# Patient Record
Sex: Female | Born: 1965 | Race: Black or African American | Hispanic: No | Marital: Single | Smoking: Never smoker
Health system: Southern US, Community
[De-identification: ages and names within clinical notes are randomized; demographics above are authoritative.]

## PROBLEM LIST (undated history)

## (undated) DIAGNOSIS — F32A Depression, unspecified: Secondary | ICD-10-CM

## (undated) DIAGNOSIS — D259 Leiomyoma of uterus, unspecified: Secondary | ICD-10-CM

## (undated) DIAGNOSIS — R42 Dizziness and giddiness: Secondary | ICD-10-CM

## (undated) DIAGNOSIS — D649 Anemia, unspecified: Secondary | ICD-10-CM

## (undated) DIAGNOSIS — F329 Major depressive disorder, single episode, unspecified: Secondary | ICD-10-CM

## (undated) DIAGNOSIS — Z531 Procedure and treatment not carried out because of patient's decision for reasons of belief and group pressure: Secondary | ICD-10-CM

## (undated) DIAGNOSIS — N920 Excessive and frequent menstruation with regular cycle: Secondary | ICD-10-CM

## (undated) DIAGNOSIS — I499 Cardiac arrhythmia, unspecified: Secondary | ICD-10-CM

## (undated) HISTORY — PX: TOOTH EXTRACTION: SUR596

## (undated) HISTORY — DX: Leiomyoma of uterus, unspecified: D25.9

## (undated) HISTORY — PX: NO PAST SURGERIES: SHX2092

## (undated) HISTORY — DX: Morbid (severe) obesity due to excess calories: E66.01

---

## 1997-05-22 ENCOUNTER — Emergency Department (HOSPITAL_COMMUNITY): Admission: EM | Admit: 1997-05-22 | Discharge: 1997-05-22 | Payer: Self-pay | Admitting: Emergency Medicine

## 1998-05-05 ENCOUNTER — Emergency Department (HOSPITAL_COMMUNITY): Admission: EM | Admit: 1998-05-05 | Discharge: 1998-05-05 | Payer: Self-pay | Admitting: Internal Medicine

## 2001-05-02 ENCOUNTER — Encounter: Admission: RE | Admit: 2001-05-02 | Discharge: 2001-05-02 | Payer: Self-pay | Admitting: *Deleted

## 2007-03-11 ENCOUNTER — Emergency Department (HOSPITAL_COMMUNITY): Admission: EM | Admit: 2007-03-11 | Discharge: 2007-03-11 | Payer: Self-pay | Admitting: Emergency Medicine

## 2010-12-29 ENCOUNTER — Encounter: Payer: Self-pay | Admitting: Emergency Medicine

## 2010-12-29 ENCOUNTER — Other Ambulatory Visit: Payer: Self-pay

## 2010-12-29 ENCOUNTER — Inpatient Hospital Stay (HOSPITAL_COMMUNITY): Payer: Self-pay

## 2010-12-29 ENCOUNTER — Emergency Department (HOSPITAL_COMMUNITY): Payer: Self-pay

## 2010-12-29 ENCOUNTER — Inpatient Hospital Stay (HOSPITAL_COMMUNITY)
Admission: EM | Admit: 2010-12-29 | Discharge: 2011-01-02 | DRG: 812 | Disposition: A | Discharge status: Home / Self Care | Payer: Self-pay | Attending: Internal Medicine | Admitting: Internal Medicine

## 2010-12-29 DIAGNOSIS — R0602 Shortness of breath: Secondary | ICD-10-CM

## 2010-12-29 DIAGNOSIS — I498 Other specified cardiac arrhythmias: Secondary | ICD-10-CM | POA: Diagnosis present

## 2010-12-29 DIAGNOSIS — IMO0001 Reserved for inherently not codable concepts without codable children: Secondary | ICD-10-CM

## 2010-12-29 DIAGNOSIS — Z531 Procedure and treatment not carried out because of patient's decision for reasons of belief and group pressure: Secondary | ICD-10-CM

## 2010-12-29 DIAGNOSIS — D649 Anemia, unspecified: Secondary | ICD-10-CM

## 2010-12-29 DIAGNOSIS — I499 Cardiac arrhythmia, unspecified: Secondary | ICD-10-CM

## 2010-12-29 DIAGNOSIS — R000 Tachycardia, unspecified: Secondary | ICD-10-CM

## 2010-12-29 DIAGNOSIS — R42 Dizziness and giddiness: Secondary | ICD-10-CM

## 2010-12-29 DIAGNOSIS — N920 Excessive and frequent menstruation with regular cycle: Secondary | ICD-10-CM | POA: Diagnosis present

## 2010-12-29 DIAGNOSIS — D509 Iron deficiency anemia, unspecified: Principal | ICD-10-CM | POA: Diagnosis present

## 2010-12-29 HISTORY — DX: Depression, unspecified: F32.A

## 2010-12-29 HISTORY — DX: Procedure and treatment not carried out because of patient's decision for reasons of belief and group pressure: Z53.1

## 2010-12-29 HISTORY — DX: Major depressive disorder, single episode, unspecified: F32.9

## 2010-12-29 HISTORY — DX: Dizziness and giddiness: R42

## 2010-12-29 HISTORY — DX: Anemia, unspecified: D64.9

## 2010-12-29 HISTORY — DX: Cardiac arrhythmia, unspecified: I49.9

## 2010-12-29 HISTORY — DX: Reserved for inherently not codable concepts without codable children: IMO0001

## 2010-12-29 LAB — CBC
HCT: 19.1 % — ABNORMAL LOW (ref 36.0–46.0)
Hemoglobin: 4.9 g/dL — CL (ref 12.0–15.0)
MCH: 14.5 pg — ABNORMAL LOW (ref 26.0–34.0)
MCH: 14.5 pg — ABNORMAL LOW (ref 26.0–34.0)
MCHC: 25.7 g/dL — ABNORMAL LOW (ref 30.0–36.0)
MCV: 56.5 fL — ABNORMAL LOW (ref 78.0–100.0)
MCV: 56.5 fL — ABNORMAL LOW (ref 78.0–100.0)
Platelets: 308 10*3/uL (ref 150–400)
Platelets: 318 10*3/uL (ref 150–400)
Platelets: 264 10*3/uL (ref 150–400)
RBC: 3.38 MIL/uL — ABNORMAL LOW (ref 3.87–5.11)
RBC: 3.59 MIL/uL — ABNORMAL LOW (ref 3.87–5.11)
RDW: 19.3 % — ABNORMAL HIGH (ref 11.5–15.5)
RDW: 19.7 % — ABNORMAL HIGH (ref 11.5–15.5)
WBC: 6.2 10*3/uL (ref 4.0–10.5)
WBC: 6.5 10*3/uL (ref 4.0–10.5)
WBC: 6.2 10*3/uL (ref 4.0–10.5)

## 2010-12-29 LAB — DIFFERENTIAL
Basophils Absolute: 0.1 10*3/uL (ref 0.0–0.1)
Basophils Relative: 1 % (ref 0–1)
Eosinophils Absolute: 0.1 10*3/uL (ref 0.0–0.7)
Eosinophils Relative: 1 % (ref 0–5)
Lymphocytes Relative: 23 % (ref 12–46)
Lymphs Abs: 1.4 10*3/uL (ref 0.7–4.0)
Monocytes Absolute: 0.6 10*3/uL (ref 0.1–1.0)
Monocytes Relative: 10 % (ref 3–12)
Neutro Abs: 4 10*3/uL (ref 1.7–7.7)
Neutrophils Relative %: 65 % (ref 43–77)

## 2010-12-29 LAB — CARDIAC PANEL(CRET KIN+CKTOT+MB+TROPI)
CK, MB: 3.1 ng/mL (ref 0.3–4.0)
CK, MB: 2.6 ng/mL (ref 0.3–4.0)
Troponin I: 0.34 ng/mL (ref <0.30)

## 2010-12-29 LAB — IRON AND TIBC
Iron: <10 ug/dL — ABNORMAL LOW (ref 42–135)
UIBC: 471 ug/dL — ABNORMAL HIGH (ref 125–400)

## 2010-12-29 LAB — URINALYSIS, ROUTINE W REFLEX MICROSCOPIC
Glucose, UA: NEGATIVE mg/dL
Hgb urine dipstick: NEGATIVE
Specific Gravity, Urine: 1.012 (ref 1.005–1.030)

## 2010-12-29 LAB — RAPID URINE DRUG SCREEN, HOSP PERFORMED
Amphetamines: NOT DETECTED
Benzodiazepines: NOT DETECTED
Cocaine: NOT DETECTED
Opiates: NOT DETECTED
Tetrahydrocannabinol: NOT DETECTED

## 2010-12-29 LAB — HIV ANTIBODY (ROUTINE TESTING W REFLEX): HIV: NONREACTIVE

## 2010-12-29 LAB — COMPREHENSIVE METABOLIC PANEL
AST: 13 U/L (ref 0–37)
BUN: 10 mg/dL (ref 6–23)
CO2: 21 mEq/L (ref 19–32)
Calcium: 8.8 mg/dL (ref 8.4–10.5)
Chloride: 109 mEq/L (ref 96–112)
Creatinine, Ser: 0.86 mg/dL (ref 0.50–1.10)
GFR calc Af Amer: >90 mL/min (ref >90)
GFR calc non Af Amer: 82 mL/min — ABNORMAL LOW (ref >90)
Glucose, Bld: 114 mg/dL — ABNORMAL HIGH (ref 70–99)
Total Bilirubin: 0.5 mg/dL (ref 0.3–1.2)

## 2010-12-29 LAB — APTT: aPTT: 33 seconds (ref 24–37)

## 2010-12-29 LAB — POCT I-STAT, CHEM 8
Creatinine, Ser: 1 mg/dL (ref 0.50–1.10)
Glucose, Bld: 92 mg/dL (ref 70–99)
Hemoglobin: 7.1 g/dL — ABNORMAL LOW (ref 12.0–15.0)
TCO2: 21 mmol/L (ref 0–100)

## 2010-12-29 LAB — PROTIME-INR
INR: 1.18 (ref 0.00–1.49)
Prothrombin Time: 15.3 seconds — ABNORMAL HIGH (ref 11.6–15.2)

## 2010-12-29 LAB — RETICULOCYTES
RBC.: 3.35 MIL/uL — ABNORMAL LOW (ref 3.87–5.11)
Retic Count, Absolute: 43.6 10*3/uL (ref 19.0–186.0)

## 2010-12-29 LAB — FERRITIN: Ferritin: 3 ng/mL — ABNORMAL LOW (ref 10–291)

## 2010-12-29 MED ORDER — SODIUM CHLORIDE 0.9 % IV SOLN
INTRAVENOUS | Status: DC
  Administered 2010-12-29: 18:00:00 via INTRAVENOUS

## 2010-12-29 MED ORDER — LORAZEPAM 2 MG/ML IJ SOLN
0.5000 mg | Freq: Once | INTRAMUSCULAR | Status: AC
  Administered 2010-12-29: 0.5 mg via INTRAVENOUS
  Filled 2010-12-29: qty 1

## 2010-12-29 MED ORDER — SODIUM CHLORIDE 0.9 % IJ SOLN
3.0000 mL | Freq: Two times a day (BID) | INTRAMUSCULAR | Status: DC
  Administered 2010-12-31 – 2011-01-01 (×3): 3 mL via INTRAVENOUS

## 2010-12-29 NOTE — ED Notes (Signed)
VSS. NAD. NSR on monitor. Reports SOB on exertion continues. Presently resp e/u, no distress

## 2010-12-29 NOTE — ED Notes (Signed)
Family at bedside. 

## 2010-12-29 NOTE — ED Notes (Signed)
Family at bedside. Continues ST on monitor

## 2010-12-29 NOTE — ED Notes (Signed)
Pt undressed and put in gown. Pt is on cardiac monitor, bp cuff, and pulse ox. EKG completed at this time

## 2010-12-29 NOTE — ED Notes (Signed)
Patient transported to X-ray 

## 2010-12-29 NOTE — H&P (Signed)
Hospital Admission Note Date: 12/29/2010  Patient name: [name] Medical record number: [id number] Date of birth: [birth year] Age: 45 y.o. Gender: female PCP: No primary provider on file.  Medical Service: Internal medicine teaching service - [facility]  Attending physician:  Dr. Ninetta Lights    1st Contact: Dr. Milbert Coulter    Pager: [phone] 2nd Contact: Dr. Loistine Chance    Pager: [phone]  After 5 pm or weekends: 1st Contact:   Pager: [phone] 2nd Contact:   Pager: [phone]  Chief Complaint:  Heart palpitations, chest pressure, shortness of breath  History of Present Illness: The patient is a 45 year old female and Jehovah's Witness who presents to the [facility] emergency room with complaint of heart palpitation, shortness of breath, and chest pressure. She states these symptoms began while at work at [employer] after she lifted it and then walked to the elevator with heavy bags in her hands.  She describes a central chest pressure with a sensation that her heart was beating very rapidly. She denies any associated nausea, vomiting, or diaphoresis. She denies syncope but states she felt very dizzy, almost as if she might pass out. She had a similar episode 7 days prior to her admission. At that time she was home when she began to experience heart palpitations and chest pressure. She states the chest pressure lasted for approximately 20 minutes and her heart rate at that time was 150 beats per minute. After approximately a half hour, her heart rate slowed down but remained faster than usual her chest pressure improved slightly; this lasted for another 2-3 hours before she went to bed.  Her symptoms were completely resolved upon waking in the morning and she did not experience another episode until the day of admission. She admits to one episode of loose stool on the day prior to admission that is now resolved. She denies any abdominal pain, bright red blood per rectum, or dark tarry stools.  Her last menstrual period  ended on November 22.  She has no other concerns or complaints at this time.  Meds: Patient does not take any prescription medications, over-the-counter medications, or herbal supplements on a daily basis She uses Advil as needed for menstrual cramps; takes 6 tabs po at once for 1-2 days during her menstrual cycle  Allergies: NKDA  Past medical history: Sickle cell trait Metrorrhagia and menorrhagia  - pt reports heavy menstrual periods that occur every 2 weeks lasting ~7 days with 4 days of very heavy bleeding    Family history: Mother: eosinophilic colitis, fibroids, iron deficiency anemia Sister: migraine, fibroids, iron deficiency anemia, irritable bowel syndrome MGM: breast cancer DN, HTN, arthritis, heart palpitations, sickle cell disease, aneurysm, CVA (uncle and GM)  Social history: Patient lives in [place] alone. 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 She is uninsured. She denies tobacco use as well as illicit drug use. She admits to rare alcohol consumption.  Review of Systems: Pertinent items are noted in HPI.  Physical Exam: Blood pressure 127/63, pulse 87, temperature 98.4 F (36.9 C), temperature source Oral, resp. rate 18, last menstrual period 12/19/2010, SpO2 100.00%. GEN: No apparent distress.  Alert and oriented x 3.  Pleasant, conversant, and cooperative to exam. HEENT: head is autraumatic and normocephalic.  Neck is supple without palpable masses or lymphadenopathy.  No JVD or carotid bruits.  Vision intact.  EOMI.  PERRLA.  Sclerae anicteric.  Conjunctivae are pale. Mucous membranes are moist.  Oropharynx is without erythema,  exudates, or other abnormal lesions.   RESP:  Lungs are clear to ascultation bilaterally with good air movement.  No wheezes, ronchi, or rubs. CARDIOVASCULAR: regular rate, normal rhythm.  Clear S1, S2, soft 2/6 systolic murmur, gallops, or rubs. ABDOMEN: soft, non-tender, non-distended.   Bowels sounds present in all quadrants and normoactive.  No palpable masses.  FOBT negative. EXT: warm and dry.  Peripheral pulses equal, intact, and +2 globally.  No clubbing or cyanosis.  No edema in bilateral lower extremities. SKIN: warm and dry with normal turgor.  No rashes or abnormal lesions observed. NEURO: CN II-XII grossly intact.  Muscle strength +5/5 in bilateral upper and lower extremities.  Sensation is grossly intact.  No focal deficit.  Lab results: Basic Metabolic Panel:  [facility] 12/29/10 0834  NA 144  K 3.8  CL 109  CO2 --  GLUCOSE 92  BUN 8  CREATININE 1.00  CALCIUM --  MG --  PHOS --   CBC:  Basename 12/29/10 0834 12/29/10 0726  WBC -- 6.2  NEUTROABS -- 4.0  HGB 7.1* 4.9*  HCT 21.0* 19.1*  MCV -- 56.5*  PLT -- 308   Cardiac Enzymes:  Basename 12/29/10 0726  CKTOTAL 56  CKMB 1.9  CKMBINDEX --  TROPONINI <0.30   D-Dimer:  Basename 12/29/10 0726  DDIMER 0.30   FOBT negative.  Anemia Panel: pending  Imaging results:  Dg Chest 2 View  12/29/2010  *RADIOLOGY REPORT*  Clinical Data: Chest pressure, shortness of breath.  CHEST - 2 VIEW  Comparison: None  Findings: Heart is mildly enlarged.  Lungs are clear.  No effusions or bony abnormality  IMPRESSION: Cardiomegaly.  No active disease.  Original Report Authenticated By: Cyndie Chime, M.D.   Assessment & Plan by Problem: #Symptomatic microcytic anemia:  The patient's hemoglobin is significantly below normal values; istat chem 8 yielded Hbg of 7.1, CBC revealed Hbg of 4.9,  Her fecal occult blood test was negative and she denies any symptoms of gastrointestinal bleeding.  It is possible her severe anemia is the result of frequent, heavy menstrual periods as she is otherwise without history or exam findings to suggest abnormal bleeding.  Ideally, I would like to transfuse her 3 units of packed RBCs however she declines any blood transfusion as this is counter to her religious beliefs.  Discussed the  benefits of transfusion and risks of anemia and possible continued decrease of her Hbg; pt expresses understanding of risks and benefits.  Will respect her wishes and not proceed with blood transfusion at this time but will readdress the issue if she decompensates. - Admit to tele - Check PT/INR, PTT - Will follow up results of anemia panel drawn in the ER - Repeat CBC to determine accurate Hbg given discrepancy in lab values - Will obtain transvaginal ultrasound to assess for uterine fibroids (if fibroids are a contributing factor to her severe anemia, she may benefit from surgical intervention from ob/gyn) - Use ped tubes for all blood draws to minimize iatrogenic worsening of anemia - Check HIV Ab - Will start iron supplementation and consider EPO if indicated  #SOB/Chest pain/tachycardia: This constellation of symptoms is very likely the result of the patient's profound anemia but ACS is also a concerning possibility.  CXR was negative for PNA, PTX, or other abnormality and d-dimer was within normal limits making PE very unlikely. - monitor on tele - 12 lead EKG in the am - cycle cardiac enzymes - Check FLP, A1c, and TSH  #DVT  prophylaxis: SCDs  Signed: Khalise Billard 12/29/2010, 10:52 AM

## 2010-12-29 NOTE — ED Notes (Signed)
Report received, assumed care.  

## 2010-12-29 NOTE — Progress Notes (Signed)
MD ( DR. Clyde Lundborg) notified of patient hemoglobin 4.5.   Patient resting comfortably.  No new orders received.  Will continue to monitor patient.

## 2010-12-29 NOTE — ED Notes (Signed)
Patient states she was at work and palpitations started around 4am while she was on break.  Patient states then she noticed some dizziness with shortness of breath and chest tightness.

## 2010-12-29 NOTE — ED Notes (Signed)
Admitting MD at bedside. Pt refusing blood transfusion due to religion

## 2010-12-29 NOTE — ED Notes (Addendum)
Lynnette Caffey, PA informed & aware of low Hb

## 2010-12-29 NOTE — ED Notes (Signed)
Estell Harpin, MD notified of abnormal lab test results

## 2010-12-29 NOTE — ED Provider Notes (Signed)
History     CSN: [id number] Arrival date & time: 12/29/2010  6:36 AM   First MD Initiated Contact with Patient 12/29/10 0645      Chief Complaint  Patient presents with  . Chest Pain    shortness of breath, palpitations    (Consider location/radiation/quality/duration/timing/severity/associated sxs/prior treatment) HPI Comments: Patient states starting at 0400 when she was on her break at work, she noticed that her heart began to race - states that began to have chest tightness and shortness of breath associated with this.  States this same thing happened while she was at home about 1 weeks ago - states that HR went up to 150, stayed for about 1 hours then spontaneously resolved.  She reports no other problem with this in the past.  States became concerned because this was lasting longer - reports is still present.  Denies fever, chills, headache, lower extremity edema or calf pain.  Patient is a 45 y.o. female presenting with chest pain. The history is provided by the patient. No language interpreter was used.  Chest Pain The chest pain began 3 - 5 hours ago. Chest pain occurs constantly. The chest pain is unchanged. At its most intense, the pain is at 5/10. The pain is currently at 5/10. The severity of the pain is moderate. The quality of the pain is described as pressure-like and tightness. The pain does not radiate. Primary symptoms include shortness of breath and palpitations. Pertinent negatives for primary symptoms include no fever, no syncope, no cough, no abdominal pain, no nausea, no vomiting, no dizziness and no altered mental status.  The palpitations also occurred with shortness of breath. The palpitations did not occur with dizziness.   Pertinent negatives for associated symptoms include no lower extremity edema, no numbness and no orthopnea. She tried nothing for the symptoms. Risk factors include obesity and lack of exercise.     History reviewed. No pertinent past medical  history.  History reviewed. No pertinent past surgical history.  History reviewed. No pertinent family history.  History  Substance Use Topics  . Smoking status: Not on file  . Smokeless tobacco: Not on file  . Alcohol Use: Not on file    OB History    Grav Para Term Preterm Abortions TAB SAB Ect Mult Living                  Review of Systems  Constitutional: Negative for fever and chills.  HENT: Negative for nosebleeds, congestion and neck pain.   Eyes: Negative for pain and visual disturbance.  Respiratory: Positive for chest tightness and shortness of breath. Negative for cough.   Cardiovascular: Positive for chest pain and palpitations. Negative for orthopnea, leg swelling and syncope.  Gastrointestinal: Negative for nausea, vomiting and abdominal pain.  Genitourinary: Negative for dysuria and flank pain.  Musculoskeletal: Negative for back pain.  Neurological: Negative for dizziness and numbness.  Psychiatric/Behavioral: Negative.  Negative for altered mental status.    Allergies  Review of patient's allergies indicates no known allergies.  Home Medications   Current Outpatient Rx  Name Route Sig Dispense Refill  . IBUPROFEN 200 MG PO TABS Oral Take 800-1,200 mg by mouth every 6 (six) hours as needed. For menstrual pain       BP 120/61  Pulse 137  Temp(Src) 98.4 F (36.9 C) (Oral)  Resp 24  SpO2 100%  LMP 12/19/2010  Physical Exam  Nursing note and vitals reviewed. Constitutional: She is oriented to person, place, and  time. She appears well-developed and well-nourished. No distress.  HENT:  Head: Normocephalic and atraumatic.  Right Ear: External ear normal.  Left Ear: External ear normal.  Mouth/Throat: Oropharynx is clear and moist. No oropharyngeal exudate.  Eyes: Conjunctivae are normal. Pupils are equal, round, and reactive to light.  Neck: Normal range of motion. Neck supple. No JVD present.  Cardiovascular: Regular rhythm, S1 normal, S2 normal  and normal pulses.   No extrasystoles are present. Tachycardia present.  Exam reveals no gallop.   No murmur heard. Pulses:      Carotid pulses are 2+ on the right side, and 2+ on the left side.      Radial pulses are 2+ on the right side, and 2+ on the left side.       Dorsalis pedis pulses are 2+ on the right side, and 2+ on the left side.       Posterior tibial pulses are 2+ on the right side, and 2+ on the left side.  Pulmonary/Chest: Effort normal and breath sounds normal. No respiratory distress. She has no wheezes. She has no rales. She exhibits no tenderness.  Abdominal: Soft. Bowel sounds are normal. She exhibits no distension. There is no tenderness.  Musculoskeletal: Normal range of motion. She exhibits no edema and no tenderness.  Neurological: She is alert and oriented to person, place, and time.  Skin: Skin is warm and dry.  Psychiatric: She has a normal mood and affect. Her behavior is normal. Judgment and thought content normal.    ED Course  Procedures (including critical care time)   Labs Reviewed  CBC  DIFFERENTIAL  I-STAT, CHEM 8  CARDIAC PANEL(CRET KIN+CKTOT+MB+TROPI)  D-DIMER, QUANTITATIVE   No results found.   Date: 12/29/2010  [id number]  Rhythm: sinus tachycardia  QRS Axis: normal  Intervals: normal and QT prolonged  ST/T Wave abnormalities: nonspecific ST/T changes  Conduction Disutrbances:nonspecific intraventricular conduction delay  Narrative Interpretation: Reviewed by Dr. Hyacinth Meeker  Old EKG Reviewed: none available   Date: 12/29/2010  Rate: 93  Rhythm: normal sinus rhythm  QRS Axis: normal  Intervals: Poor r wave progression  ST/T Wave abnormalities: normal  Conduction Disutrbances:none  Narrative Interpretation: Reviewed by Dr. Estell Harpin  Old EKG Reviewed: changes noted CRITICAL CARE Performed by: Cherrie Distance C.   Total critical care time: 30 minutes  Critical care time was exclusive of separately billable procedures and treating other  patients.  Critical care was necessary to treat or prevent imminent or life-threatening deterioration.  Critical care was time spent personally by me on the following activities: development of treatment plan with patient and/or surrogate as well as nursing, discussions with consultants, evaluation of patient's response to treatment, examination of patient, obtaining history from patient or surrogate, ordering and performing treatments and interventions, ordering and review of laboratory studies, ordering and review of radiographic studies, pulse oximetry and re-evaluation of patient's condition.  Chest pain anemia   MDM  Patient here with marked symptomatic anemia.  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  She will be admitted to [facility] Teaching service for this.  I have sent off an anemia panel on her as well.  She is heme negative from below but reports a history of heavy menstrual bleeding and having been told that she was anemic in the past.  She reports her LMP was 2 weeks ago, lasted a week with 4 days  of "heavy" bleeding.  Of note, upon doing the rectal examination the patient then converted from sinus tachycardia to a regular sinus rhythm.          Izola Price [place], [place] 12/29/10 1022  Scarlette Calico C. [place], [place] 12/29/10 1024

## 2010-12-29 NOTE — Progress Notes (Signed)
Lab called with critical values of Hemoglobin at 5.2 and Hct at 20.1.  Dr. Milbert Coulter aware.   Baird Lyons 2:56 PM 12/29/10

## 2010-12-29 NOTE — ED Provider Notes (Signed)
Medical screening examination/treatment/procedure(s) were performed by non-physician practitioner and as supervising physician I was immediately available for consultation/collaboration.   Gwendola Hornaday L Belmira Daley, MD 12/29/10 1458 

## 2010-12-29 NOTE — Progress Notes (Signed)
Patient ID: [name], female   DOB: 09/25/1965, 45 y.o.   MRN: [id number] Internal Medicine Teaching Service Attending Note Date: 12/29/2010  Patient name: [name]  Medical record number: [id number]  Date of birth: 11/19/1965   I have seen and evaluated [name] and discussed their care with the Residency Team.   45 yo F with hx of Jehovah's Witness and menorrhagia. She completed her most recent menstrual cycle on November 22. During that time on two  separate days she took 6 Advil each day. 5 days ago she had an episode of tachycardia. She took her heart rate and it was at least 150 per her account. She also had heaviness in her chest at that time. The episode resolved and she was asymptomatic until this morning. At roughly 4 AM, she began to have chest heaviness as well at the feeling of her heart racing. She had no diaphoresis, she had no radiation into her arm or neck. The episode resolved today when she had a rectal exam.    Physical Exam: Blood pressure 158/76, pulse 85, temperature 98.6 F (37 C), temperature source Oral, resp. rate 20, last menstrual period 12/19/2010, SpO2 100.00%. BP 158/76  Pulse 85  Temp(Src) 98.6 F (37 C) (Oral)  Resp 20  SpO2 100%  LMP 12/19/2010  General Appearance:    Alert, cooperative, no distress, appears stated age, morbid obesity  Head:    Normocephalic, without obvious abnormality, atraumatic  Eyes:    PERRL, conjunctiva/corneas clear, EOM's intact,        Throat:   Lips, mucosa, and tongue normal; teeth and gums normal  Neck:   Supple, symmetrical, trachea midline, no adenopathy;    thyroid:  no enlargement/tenderness/nodules;      Lungs:     Clear to auscultation bilaterally, respirations unlabored      Heart:    Regular rate and rhythm, S1 and S2 normal, no murmur, rub   or gallop     Abdomen:     Soft, non-tender, bowel sounds active all four quadrants,    no masses, no organomegaly        Extremities:   Extremities normal,  atraumatic, non-pitting edema     Skin:   Skin color, texture, turgor normal, no rashes or lesions     Neurologic:   CNII-XII intact, normal strength, sensation     Lab results: Results for orders placed during the hospital encounter of 12/29/10 (from the past 24 hour(s))  CBC     Status: Abnormal   Collection Time   12/29/10  7:26 AM      Component Value Range   WBC 6.2  4.0 - 10.5 (K/uL)   RBC 3.38 (*) 3.87 - 5.11 (MIL/uL)   Hemoglobin 4.9 (*) 12.0 - 15.0 (g/dL)   HCT 78.4 (*) 69.6 - 46.0 (%)   MCV 56.5 (*) 78.0 - 100.0 (fL)   MCH 14.5 (*) 26.0 - 34.0 (pg)   MCHC 25.7 (*) 30.0 - 36.0 (g/dL)   RDW 29.5 (*) 28.4 - 15.5 (%)   Platelets 308  150 - 400 (K/uL)  DIFFERENTIAL     Status: Normal   Collection Time   12/29/10  7:26 AM      Component Value Range   Neutrophils Relative 65  43 - 77 (%)   Lymphocytes Relative 23  12 - 46 (%)   Monocytes Relative 10  3 - 12 (%)   Eosinophils Relative 1  0 - 5 (%)   Basophils  Relative 1  0 - 1 (%)   Neutro Abs 4.0  1.7 - 7.7 (K/uL)   Lymphs Abs 1.4  0.7 - 4.0 (K/uL)   Monocytes Absolute 0.6  0.1 - 1.0 (K/uL)   Eosinophils Absolute 0.1  0.0 - 0.7 (K/uL)   Basophils Absolute 0.1  0.0 - 0.1 (K/uL)   RBC Morphology TARGET CELLS    CARDIAC PANEL(CRET KIN+CKTOT+MB+TROPI)     Status: Normal   Collection Time   12/29/10  7:26 AM      Component Value Range   Total CK 56  7 - 177 (U/L)   CK, MB 1.9  0.3 - 4.0 (ng/mL)   Troponin I <0.30  <0.30 (ng/mL)   Relative Index RELATIVE INDEX IS INVALID  0.0 - 2.5   D-DIMER, QUANTITATIVE     Status: Normal   Collection Time   12/29/10  7:26 AM      Component Value Range   D-Dimer, Quant 0.30  0.00 - 0.48 (ug/mL-FEU)  POCT I-STAT, CHEM 8     Status: Abnormal   Collection Time   12/29/10  8:34 AM      Component Value Range   Sodium 144  135 - 145 (mEq/L)   Potassium 3.8  3.5 - 5.1 (mEq/L)   Chloride 109  96 - 112 (mEq/L)   BUN 8  6 - 23 (mg/dL)   Creatinine, Ser 1.61  0.50 - 1.10 (mg/dL)    Glucose, Bld 92  70 - 99 (mg/dL)   Calcium, Ion 0.96  0.45 - 1.32 (mmol/L)   TCO2 21  0 - 100 (mmol/L)   Hemoglobin 7.1 (*) 12.0 - 15.0 (g/dL)   HCT 40.9 (*) 81.1 - 46.0 (%)  RETICULOCYTES     Status: Abnormal   Collection Time   12/29/10  8:53 AM      Component Value Range   Retic Ct Pct 1.3  0.4 - 3.1 (%)   RBC. 3.35 (*) 3.87 - 5.11 (MIL/uL)   Retic Count, Manual 43.6  19.0 - 186.0 (K/uL)  OCCULT BLOOD, POC DEVICE     Status: Normal   Collection Time   12/29/10  9:17 AM      Component Value Range   Fecal Occult Bld NEGATIVE    URINALYSIS, ROUTINE W REFLEX MICROSCOPIC     Status: Normal   Collection Time   12/29/10  9:28 AM      Component Value Range   Color, Urine YELLOW  YELLOW    APPearance CLEAR  CLEAR    Specific Gravity, Urine 1.012  1.005 - 1.030    pH 6.0  5.0 - 8.0    Glucose, UA NEGATIVE  NEGATIVE (mg/dL)   Hgb urine dipstick NEGATIVE  NEGATIVE    Bilirubin Urine NEGATIVE  NEGATIVE    Ketones, ur NEGATIVE  NEGATIVE (mg/dL)   Protein, ur NEGATIVE  NEGATIVE (mg/dL)   Urobilinogen, UA 1.0  0.0 - 1.0 (mg/dL)   Nitrite NEGATIVE  NEGATIVE    Leukocytes, UA NEGATIVE  NEGATIVE   URINE RAPID DRUG SCREEN (HOSP PERFORMED)     Status: Normal   Collection Time   12/29/10  9:28 AM      Component Value Range   Opiates NONE DETECTED  NONE DETECTED    Cocaine NONE DETECTED  NONE DETECTED    Benzodiazepines NONE DETECTED  NONE DETECTED    Amphetamines NONE DETECTED  NONE DETECTED    Tetrahydrocannabinol NONE DETECTED  NONE DETECTED    Barbiturates NONE DETECTED  NONE DETECTED   PREGNANCY, URINE     Status: Normal   Collection Time   12/29/10  9:28 AM      Component Value Range   Preg Test, Ur NEGATIVE    COMPREHENSIVE METABOLIC PANEL     Status: Abnormal   Collection Time   12/29/10  1:58 PM      Component Value Range   Sodium 140  135 - 145 (mEq/L)   Potassium 3.8  3.5 - 5.1 (mEq/L)   Chloride 109  96 - 112 (mEq/L)   CO2 21  19 - 32 (mEq/L)   Glucose, Bld 114 (*) 70  - 99 (mg/dL)   BUN 10  6 - 23 (mg/dL)   Creatinine, Ser 8.29  0.50 - 1.10 (mg/dL)   Calcium 8.8  8.4 - 56.2 (mg/dL)   Total Protein 7.1  6.0 - 8.3 (g/dL)   Albumin 3.6  3.5 - 5.2 (g/dL)   AST 13  0 - 37 (U/L)   ALT 11  0 - 35 (U/L)   Alkaline Phosphatase 79  39 - 117 (U/L)   Total Bilirubin 0.5  0.3 - 1.2 (mg/dL)   GFR calc non Af Amer 82 (*) >90 (mL/min)   GFR calc Af Amer >90  >90 (mL/min)  CBC     Status: Abnormal   Collection Time   12/29/10  1:58 PM      Component Value Range   WBC 6.5  4.0 - 10.5 (K/uL)   RBC 3.59 (*) 3.87 - 5.11 (MIL/uL)   Hemoglobin 5.2 (*) 12.0 - 15.0 (g/dL)   HCT 13.0 (*) 86.5 - 46.0 (%)   MCV 56.3 (*) 78.0 - 100.0 (fL)   MCH 14.5 (*) 26.0 - 34.0 (pg)   MCHC 25.7 (*) 30.0 - 36.0 (g/dL)   RDW 78.4 (*) 69.6 - 15.5 (%)   Platelets 318  150 - 400 (K/uL)  PROTIME-INR     Status: Abnormal   Collection Time   12/29/10  1:58 PM      Component Value Range   Prothrombin Time 15.3 (*) 11.6 - 15.2 (seconds)   INR 1.18  0.00 - 1.49   APTT     Status: Normal   Collection Time   12/29/10  1:58 PM      Component Value Range   aPTT 33  24 - 37 (seconds)  CARDIAC PANEL(CRET KIN+CKTOT+MB+TROPI)     Status: Abnormal   Collection Time   12/29/10  2:05 PM      Component Value Range   Total CK 67  7 - 177 (U/L)   CK, MB 3.1  0.3 - 4.0 (ng/mL)   Troponin I 0.40 (*) <0.30 (ng/mL)   Relative Index RELATIVE INDEX IS INVALID  0.0 - 2.5     Imaging results:  Dg Chest 2 View  12/29/2010  *RADIOLOGY REPORT*  Clinical Data: Chest pressure, shortness of breath.  CHEST - 2 VIEW  Comparison: None  Findings: Heart is mildly enlarged.  Lungs are clear.  No effusions or bony abnormality  IMPRESSION: Cardiomegaly.  No active disease.  Original Report Authenticated By: Cyndie Chime, M.D.   US Transvaginal Non-ob  12/29/2010  *RADIOLOGY REPORT*  Clinical Data: Dysmenorrhea and anemia  TRANSABDOMINAL AND TRANSVAGINAL ULTRASOUND OF PELVIS Technique:  Both transabdominal and  transvaginal ultrasound examinations of the pelvis were performed. Transabdominal technique was performed for global imaging of the pelvis including uterus, ovaries, adnexal regions, and pelvic cul-de-sac.  Comparison: None.   It was  necessary to proceed with endovaginal exam following the transabdominal exam to visualize the endometrium.  Findings:  The uterus is large and lobular in contour, measuring 16.1 x 9.0 x 11.1 cm.  There is an 8.6 cm fibroid in the anterior central fundus which displaces the endometrial stripe to the right of midline which suggests that this is myometrial rather than submucosal in location.  Endometrial stripe is not thickened, measuring approximately 10 mm.  Two smaller sub centimeter myometrial fibroids are noted at the anterior lower uterine segment.  Both ovaries have a normal size and appearance.  The right ovary measures 1.9 x 2.7 x 1.7 cm, and the left ovary measures 3.9 x 4.2 x 3.7 cm.  There are no adnexal masses or free pelvic fluid.  IMPRESSION: Fibroid uterus.  The largest fibroid measures 8.6 cm and is at the central fundus displacing endometrium to the right of midline which suggests that this is myometrial rather than submucosal.  Original Report Authenticated By: Brandon Melnick, M.D.   US Pelvis Complete  12/29/2010  *RADIOLOGY REPORT*  Clinical Data: Dysmenorrhea and anemia  TRANSABDOMINAL AND TRANSVAGINAL ULTRASOUND OF PELVIS Technique:  Both transabdominal and transvaginal ultrasound examinations of the pelvis were performed. Transabdominal technique was performed for global imaging of the pelvis including uterus, ovaries, adnexal regions, and pelvic cul-de-sac.  Comparison: None.   It was necessary to proceed with endovaginal exam following the transabdominal exam to visualize the endometrium.  Findings:  The uterus is large and lobular in contour, measuring 16.1 x 9.0 x 11.1 cm.  There is an 8.6 cm fibroid in the anterior central fundus which displaces the  endometrial stripe to the right of midline which suggests that this is myometrial rather than submucosal in location.  Endometrial stripe is not thickened, measuring approximately 10 mm.  Two smaller sub centimeter myometrial fibroids are noted at the anterior lower uterine segment.  Both ovaries have a normal size and appearance.  The right ovary measures 1.9 x 2.7 x 1.7 cm, and the left ovary measures 3.9 x 4.2 x 3.7 cm.  There are no adnexal masses or free pelvic fluid.  IMPRESSION: Fibroid uterus.  The largest fibroid measures 8.6 cm and is at the central fundus displacing endometrium to the right of midline which suggests that this is myometrial rather than submucosal.  Original Report Authenticated By: Brandon Melnick, M.D.    Assessment and Plan: I agree with the formulated Assessment and Plan with the following changes:  -Anemia Somewhat limited options with her religious preferences. We will reassess this after her family is not present. Optimally we would like to transfer her to use her to do at least 7. Is not clear if the source of her bleeding is her heavy periods or her recent NSAID use. She is FOBT negative. It is possible that she could be bleeding from enlarged fibroids. With her her hemoglobin at its current level, it is unlikely that she would be a good surgical candidate for removal of these. -Chest Pain Most likely this is due to her anemia. Will follow her CEs, watch her on tele.

## 2010-12-29 NOTE — ED Notes (Signed)
Noted pt's HR now less than 100. NSR on monitor without ectopy.

## 2010-12-29 NOTE — ED Notes (Signed)
Patient at work, started having shortness of breath and palpitations, with some chest tightness and dizziness.  Patient denies any N/v or diaphoresis at this time.  Has had this once in the past and has resolved.

## 2010-12-30 LAB — CARDIAC PANEL(CRET KIN+CKTOT+MB+TROPI)
CK, MB: 2.3 ng/mL (ref 0.3–4.0)
Total CK: 55 U/L (ref 7–177)

## 2010-12-30 LAB — LIPID PANEL
LDL Cholesterol: 42 mg/dL (ref 0–99)
VLDL: 10 mg/dL (ref 0–40)

## 2010-12-30 LAB — LACTATE DEHYDROGENASE: LDH: 163 U/L (ref 94–250)

## 2010-12-30 LAB — CBC
MCHC: 25.8 g/dL — ABNORMAL LOW (ref 30.0–36.0)
Platelets: 249 10*3/uL (ref 150–400)
RDW: 19.7 % — ABNORMAL HIGH (ref 11.5–15.5)

## 2010-12-30 LAB — BASIC METABOLIC PANEL
BUN: 8 mg/dL (ref 6–23)
Creatinine, Ser: 0.71 mg/dL (ref 0.50–1.10)
GFR calc Af Amer: >90 mL/min (ref >90)
GFR calc non Af Amer: >90 mL/min (ref >90)
Potassium: 4.2 mEq/L (ref 3.5–5.1)

## 2010-12-30 LAB — HEMOGLOBIN A1C: Hgb A1c MFr Bld: 4.9 % (ref <5.7)

## 2010-12-30 MED ORDER — FERROUS SULFATE 325 (65 FE) MG PO TABS
325.0000 mg | ORAL_TABLET | Freq: Every day | ORAL | Status: DC
  Filled 2010-12-30: qty 1

## 2010-12-30 MED ORDER — SODIUM CHLORIDE 0.9 % IV SOLN: 1020.0000 mg | Freq: Once | INTRAVENOUS | Status: DC

## 2010-12-30 MED ORDER — FERUMOXYTOL INJECTION 510 MG/17 ML
510.0000 mg | Freq: Once | INTRAVENOUS | Status: DC
  Filled 2010-12-30: qty 17

## 2010-12-30 MED ORDER — FERUMOXYTOL INJECTION 510 MG/17 ML
510.0000 mg | Freq: Once | INTRAVENOUS | Status: AC
  Administered 2010-12-30: 510 mg via INTRAVENOUS
  Filled 2010-12-30: qty 17

## 2010-12-30 NOTE — Progress Notes (Signed)
Patient ID: [name], female   DOB: 06-30-1965, 45 y.o.   MRN: [id number] Internal Medicine Teaching Service Attending Note Date: 12/30/2010  Patient name: [name]  Medical record number: [id number]  Date of birth: 04-14-1965    This patient has been seen and discussed with the house staff. Please see their note for complete details. I concur with their findings with the following additions/corrections: Denies chest pain, dizziness, SOB. Spoke with pt and her mother about getting IV iron and possible ADR (especially anaphylaxis). Assured them that this is rare and that if needed we can protect her airway. they agree to proceed.  Johny Sax 12/30/2010, 3:07 PM

## 2010-12-30 NOTE — Progress Notes (Addendum)
Subjective: No acute issues ovrnight.  Overnight Hb found to be 4.5, but pt was resting.  Pt became anxious upon discussion of condition.  Agreeable to starting IV iron.  No BM since yesterday.  No CP, but palpitations when coming back from restroom.   Objective: Vital signs in last 24 hours: Filed Vitals:   12/29/10 1200 12/29/10 1315 12/29/10 2052 12/30/10 0432  BP: 127/63 158/76 118/70 132/71  Pulse: 87 85 76 67  Temp:  98.6 F (37 C) 98.5 F (36.9 C) 98.6 F (37 C)  TempSrc:  Oral Oral Oral  Resp: 22 20 20 19   Height:  5\' 8"  (1.727 m)    Weight:  180 lb (81.647 kg)    SpO2: 100% 100% 100% 100%   Weight change:   Intake/Output Summary (Last 24 hours) at 12/30/10 1305 Last data filed at 12/30/10 0743  Gross per 24 hour  Intake   2280 ml  Output    500 ml  Net   1780 ml   Physical Exam: General: resting in bed, no acute distress HEENT: PERRL, EOMI, no scleral icterus, + conjunctival pallor Cardiac: RRR, no rubs, murmurs or gallops Pulm: clear to auscultation bilaterally, moving normal volumes of air Abd: soft, nontender, nondistended, BS normoactive, obese Ext: warm and well perfused, no pedal edema Neuro: alert and oriented X3, cranial nerves II-XII grossly intact, strength and sensation to light touch equal in bilateral upper and lower extremities  Lab Results: Basic Metabolic Panel:  Lab 12/30/10 1610 12/29/10 1358  NA 141 140  K 4.2 3.8  CL 112 109  CO2 22 21  GLUCOSE 79 114*  BUN 8 10  CREATININE 0.71 0.86  CALCIUM 8.4 8.8  MG -- --  PHOS -- --   Liver Function Tests:  Lab 12/29/10 1358  AST 13  ALT 11  ALKPHOS 79  BILITOT 0.5  PROT 7.1  ALBUMIN 3.6   CBC:  Lab 12/30/10 0505 12/29/10 2023 12/29/10 0726  WBC 4.4 6.2 --  NEUTROABS -- -- 4.0  HGB 4.6* 4.5* --  HCT 17.8* 17.3* --  MCV 56.5* 56.5* --  PLT 249 264 --   Cardiac Enzymes:  Lab 12/30/10 0505 12/29/10 2023 12/29/10 1405  CKTOTAL 55 69 67  CKMB 2.3 2.6 3.1  CKMBINDEX -- -- --    TROPONINI <0.30 0.34* 0.40*   D-Dimer:  Lab 12/29/10 0726  DDIMER 0.30   Hemoglobin A1C:  Lab 12/29/10 1358  HGBA1C 4.9   Fasting Lipid Panel:  Lab 12/30/10 0505  CHOL 107  HDL 55  LDLCALC 42  TRIG 50  CHOLHDL 1.9  LDLDIRECT --   Thyroid Function Tests:  Lab 12/29/10 1358  TSH 1.950  T4TOTAL --  FREET4 --  T3FREE --  THYROIDAB --   Coagulation:  Lab 12/29/10 1358  LABPROT 15.3*  INR 1.18   Anemia Panel:  Lab 12/29/10 0853  VITAMINB12 255  FOLATE 9.4  FERRITIN 3*  TIBC Not calculated due to Iron <10.  IRON <10*  RETICCTPCT 1.3   Urine Drug Screen: Drugs of Abuse     Component Value Date/Time   LABOPIA NONE DETECTED 12/29/2010 0928   COCAINSCRNUR NONE DETECTED 12/29/2010 0928   LABBENZ NONE DETECTED 12/29/2010 0928   AMPHETMU NONE DETECTED 12/29/2010 0928   THCU NONE DETECTED 12/29/2010 0928   LABBARB NONE DETECTED 12/29/2010 9604    Micro Results: No results found for this or any previous visit (from the past 240 hour(s)). Studies/Results: Dg Chest 2 View  12/29/2010  *RADIOLOGY REPORT*  Clinical Data: Chest pressure, shortness of breath.  CHEST - 2 VIEW  Comparison: None  Findings: Heart is mildly enlarged.  Lungs are clear.  No effusions or bony abnormality  IMPRESSION: Cardiomegaly.  No active disease.  Original Report Authenticated By: Cyndie Chime, M.D.   US Transvaginal Non-ob  12/29/2010  *RADIOLOGY REPORT*  Clinical Data: Dysmenorrhea and anemia  TRANSABDOMINAL AND TRANSVAGINAL ULTRASOUND OF PELVIS Technique:  Both transabdominal and transvaginal ultrasound examinations of the pelvis were performed. Transabdominal technique was performed for global imaging of the pelvis including uterus, ovaries, adnexal regions, and pelvic cul-de-sac.  Comparison: None.   It was necessary to proceed with endovaginal exam following the transabdominal exam to visualize the endometrium.  Findings:  The uterus is large and lobular in contour, measuring 16.1 x  9.0 x 11.1 cm.  There is an 8.6 cm fibroid in the anterior central fundus which displaces the endometrial stripe to the right of midline which suggests that this is myometrial rather than submucosal in location.  Endometrial stripe is not thickened, measuring approximately 10 mm.  Two smaller sub centimeter myometrial fibroids are noted at the anterior lower uterine segment.  Both ovaries have a normal size and appearance.  The right ovary measures 1.9 x 2.7 x 1.7 cm, and the left ovary measures 3.9 x 4.2 x 3.7 cm.  There are no adnexal masses or free pelvic fluid.  IMPRESSION: Fibroid uterus.  The largest fibroid measures 8.6 cm and is at the central fundus displacing endometrium to the right of midline which suggests that this is myometrial rather than submucosal.  Original Report Authenticated By: Brandon Melnick, M.D.   US Pelvis Complete  12/29/2010  *RADIOLOGY REPORT*  Clinical Data: Dysmenorrhea and anemia  TRANSABDOMINAL AND TRANSVAGINAL ULTRASOUND OF PELVIS Technique:  Both transabdominal and transvaginal ultrasound examinations of the pelvis were performed. Transabdominal technique was performed for global imaging of the pelvis including uterus, ovaries, adnexal regions, and pelvic cul-de-sac.  Comparison: None.   It was necessary to proceed with endovaginal exam following the transabdominal exam to visualize the endometrium.  Findings:  The uterus is large and lobular in contour, measuring 16.1 x 9.0 x 11.1 cm.  There is an 8.6 cm fibroid in the anterior central fundus which displaces the endometrial stripe to the right of midline which suggests that this is myometrial rather than submucosal in location.  Endometrial stripe is not thickened, measuring approximately 10 mm.  Two smaller sub centimeter myometrial fibroids are noted at the anterior lower uterine segment.  Both ovaries have a normal size and appearance.  The right ovary measures 1.9 x 2.7 x 1.7 cm, and the left ovary measures 3.9 x 4.2 x  3.7 cm.  There are no adnexal masses or free pelvic fluid.  IMPRESSION: Fibroid uterus.  The largest fibroid measures 8.6 cm and is at the central fundus displacing endometrium to the right of midline which suggests that this is myometrial rather than submucosal.  Original Report Authenticated By: Brandon Melnick, M.D.   Medications: I have reviewed the patient's current medications. Scheduled Meds:   . ferumoxytol  1,020 mg Intravenous Once  . sodium chloride  3 mL Intravenous Q12H   Continuous Infusions:   . sodium chloride 100 mL/hr at 12/30/10 0630   Assessment/Plan: #Microcytic Iron deficiency Anemia: Ferritin = 3, pt is Jahoveh's Witness, so refuses transfusion; Hb of 4.9 at admission.  This is the reason for her SOB/chest pressure/palpitations.  Discussed with  Heme, who suggested IV iron supplementation and then call back if needed for further advice (Dr. Gaylyn Rong).  Likely 2/2 menometrorrhagia, but doing hemolysis work up as well.   Plan:  -IV iron today (510mg  today) then 510 IV Feraheme again in 72h, then pt will need PO supplementation; I wrote for ferrous sulfate 325 tab qBreakfast starting Tuesday, she will need to increase frequency to 2-3 times per day, but given GI side effects, I thought we we start with once daily given IV iron tx today.   -LDH, Haptoglobin & peripheral smear ordered -Consider outpatient GYN consult for hysterectomy (US revealed fibroids), consider starting OCP for better cycle mgmt (current cycle is q2weeks)  #VTE Prophylaxis: SCDs   LOS: 1 day   KAPADIA, Reganne Messerschmidt 12/30/2010, 1:05 PM

## 2010-12-30 NOTE — H&P (Signed)
Internal Medicine Teaching Service Attending Note Date: 12/30/2010  Patient name: [name]  Medical record number: [id number]  Date of birth: Aug 21, 1965    This patient has been seen and discussed with the house staff. Please see their note for complete details. I concur with their findings with the following additions/corrections: appreciate Dr Arvilla Market Excellent note. Please see my note as well.  Johny Sax 12/30/2010, 3:06 PM

## 2010-12-30 NOTE — Progress Notes (Signed)
Clinical Social Worker received consult re: Advanced Directives. CSW visited with patient and briefly explained advanced directives (Living Will and HCPOA) and patient accepted packed, however she may not be ready to make a decision on having anything notarized this hospital stay. Patient advised that her social worker is Bonnye Fava, and CSW advised.  Genelle Bal, MSW, LCSW [phone]

## 2010-12-31 DIAGNOSIS — D649 Anemia, unspecified: Secondary | ICD-10-CM

## 2010-12-31 DIAGNOSIS — R000 Tachycardia, unspecified: Secondary | ICD-10-CM

## 2010-12-31 LAB — CBC
Hemoglobin: 5.5 g/dL — CL (ref 12.0–15.0)
MCH: 14.6 pg — ABNORMAL LOW (ref 26.0–34.0)
MCHC: 25.8 g/dL — ABNORMAL LOW (ref 30.0–36.0)
MCV: 56.6 fL — ABNORMAL LOW (ref 78.0–100.0)
RBC: 3.76 MIL/uL — ABNORMAL LOW (ref 3.87–5.11)

## 2010-12-31 LAB — HAPTOGLOBIN: Haptoglobin: 110 mg/dL (ref 30–200)

## 2010-12-31 NOTE — Progress Notes (Signed)
Subjective: Pt feeling well today.  No acute events overnight.  Tolerated IV iron well yesterday.  No further sob, chest pain, or palpitations since yesterday am.  Objective: Vital signs in last 24 hours: Filed Vitals:   12/30/10 0432 12/30/10 1346 12/30/10 2016 12/31/10 0806  BP: 132/71 142/85 121/75 131/80  Pulse: 67 80 72 75  Temp: 98.6 F (37 C) 97.8 F (36.6 C) 97.8 F (36.6 C) 97 F (36.1 C)  TempSrc: Oral Oral Oral Oral  Resp: 19 20 20 20   Height:      Weight:      SpO2: 100% 100% 97% 100%   Weight change:   Intake/Output Summary (Last 24 hours) at 12/31/10 1300 Last data filed at 12/31/10 0806  Gross per 24 hour  Intake   2330 ml  Output    400 ml  Net   1930 ml   Physical Exam: General: resting in bed, no acute distress  HEENT: PERRL, EOMI, no scleral icterus, + conjunctival pallor  Cardiac: RRR, no rubs, murmurs or gallops  Pulm: clear to auscultation bilaterally, moving normal volumes of air  Abd: soft, nontender, nondistended, BS normoactive, obese  Ext: warm and well perfused, no pedal edema  Neuro: alert and oriented X3, cranial nerves II-XII grossly intact, strength and sensation to light touch equal in bilateral upper and lower extremities  Lab Results: Basic Metabolic Panel:  Lab 12/30/10 4098 12/29/10 1358  NA 141 140  K 4.2 3.8  CL 112 109  CO2 22 21  GLUCOSE 79 114*  BUN 8 10  CREATININE 0.71 0.86  CALCIUM 8.4 8.8  MG -- --  PHOS -- --   Liver Function Tests:  Lab 12/29/10 1358  AST 13  ALT 11  ALKPHOS 79  BILITOT 0.5  PROT 7.1  ALBUMIN 3.6   CBC:  Lab 12/31/10 1000 12/30/10 0505 12/29/10 0726  WBC 5.0 4.4 --  NEUTROABS -- -- 4.0  HGB 5.5* 4.6* --  HCT 21.3* 17.8* --  MCV 56.6* 56.5* --  PLT 290 249 --   Cardiac Enzymes:  Lab 12/30/10 0505 12/29/10 2023 12/29/10 1405  CKTOTAL 55 69 67  CKMB 2.3 2.6 3.1  CKMBINDEX -- -- --  TROPONINI <0.30 0.34* 0.40*   BNP: No results found for this basename: POCBNP:3 in the last  168 hours D-Dimer:  Lab 12/29/10 0726  DDIMER 0.30   CBG: No results found for this basename: GLUCAP:6 in the last 168 hours Hemoglobin A1C:  Lab 12/29/10 1358  HGBA1C 4.9   Fasting Lipid Panel:  Lab 12/30/10 0505  CHOL 107  HDL 55  LDLCALC 42  TRIG 50  CHOLHDL 1.9  LDLDIRECT --   Thyroid Function Tests:  Lab 12/29/10 1358  TSH 1.950  T4TOTAL --  FREET4 --  T3FREE --  THYROIDAB --   Coagulation:  Lab 12/29/10 1358  LABPROT 15.3*  INR 1.18   Anemia Panel:  Lab 12/29/10 0853  VITAMINB12 255  FOLATE 9.4  FERRITIN 3*  TIBC Not calculated due to Iron <10.  IRON <10*  RETICCTPCT 1.3   Urine Drug Screen: Drugs of Abuse     Component Value Date/Time   LABOPIA NONE DETECTED 12/29/2010 0928   COCAINSCRNUR NONE DETECTED 12/29/2010 0928   LABBENZ NONE DETECTED 12/29/2010 0928   AMPHETMU NONE DETECTED 12/29/2010 0928   THCU NONE DETECTED 12/29/2010 0928   LABBARB NONE DETECTED 12/29/2010 0928    Al  Micro Results: Recent Results (from the past 240 hour(s))  TECHNOLOGIST  SMEAR REVIEW     Status: Normal   Collection Time   12/30/10  1:37 PM      Component Value Range Status Comment   Path Review SPHEROCYTES   Final    Studies/Results: US Transvaginal Non-ob  12/29/2010  *RADIOLOGY REPORT*  Clinical Data: Dysmenorrhea and anemia  TRANSABDOMINAL AND TRANSVAGINAL ULTRASOUND OF PELVIS Technique:  Both transabdominal and transvaginal ultrasound examinations of the pelvis were performed. Transabdominal technique was performed for global imaging of the pelvis including uterus, ovaries, adnexal regions, and pelvic cul-de-sac.  Comparison: None.   It was necessary to proceed with endovaginal exam following the transabdominal exam to visualize the endometrium.  Findings:  The uterus is large and lobular in contour, measuring 16.1 x 9.0 x 11.1 cm.  There is an 8.6 cm fibroid in the anterior central fundus which displaces the endometrial stripe to the right of midline  which suggests that this is myometrial rather than submucosal in location.  Endometrial stripe is not thickened, measuring approximately 10 mm.  Two smaller sub centimeter myometrial fibroids are noted at the anterior lower uterine segment.  Both ovaries have a normal size and appearance.  The right ovary measures 1.9 x 2.7 x 1.7 cm, and the left ovary measures 3.9 x 4.2 x 3.7 cm.  There are no adnexal masses or free pelvic fluid.  IMPRESSION: Fibroid uterus.  The largest fibroid measures 8.6 cm and is at the central fundus displacing endometrium to the right of midline which suggests that this is myometrial rather than submucosal.  Original Report Authenticated By: Brandon Melnick, M.D.   US Pelvis Complete  12/29/2010  *RADIOLOGY REPORT*  Clinical Data: Dysmenorrhea and anemia  TRANSABDOMINAL AND TRANSVAGINAL ULTRASOUND OF PELVIS Technique:  Both transabdominal and transvaginal ultrasound examinations of the pelvis were performed. Transabdominal technique was performed for global imaging of the pelvis including uterus, ovaries, adnexal regions, and pelvic cul-de-sac.  Comparison: None.   It was necessary to proceed with endovaginal exam following the transabdominal exam to visualize the endometrium.  Findings:  The uterus is large and lobular in contour, measuring 16.1 x 9.0 x 11.1 cm.  There is an 8.6 cm fibroid in the anterior central fundus which displaces the endometrial stripe to the right of midline which suggests that this is myometrial rather than submucosal in location.  Endometrial stripe is not thickened, measuring approximately 10 mm.  Two smaller sub centimeter myometrial fibroids are noted at the anterior lower uterine segment.  Both ovaries have a normal size and appearance.  The right ovary measures 1.9 x 2.7 x 1.7 cm, and the left ovary measures 3.9 x 4.2 x 3.7 cm.  There are no adnexal masses or free pelvic fluid.  IMPRESSION: Fibroid uterus.  The largest fibroid measures 8.6 cm and is at the  central fundus displacing endometrium to the right of midline which suggests that this is myometrial rather than submucosal.  Original Report Authenticated By: Brandon Melnick, M.D.   Medications: I have reviewed the patient's current medications. Scheduled Meds:   . ferumoxytol  510 mg Intravenous Once  . ferumoxytol  510 mg Intravenous Once  . sodium chloride  3 mL Intravenous Q12H  . DISCONTD: ferrous sulfate  325 mg Oral Q breakfast  . DISCONTD: ferumoxytol  1,020 mg Intravenous Once   Continuous Infusions:   . sodium chloride 100 mL/hr at 12/30/10 0630   PRN Meds:. Assessment/Plan: Subjective: No acute issues ovrnight.  Overnight Hb found to be 4.5, but pt was  resting.  Pt became anxious upon discussion of condition.  Agreeable to starting IV iron.  No BM since yesterday.  No CP, but palpitations when coming back from restroom.    Objective: Vital signs in last 24 hours: Filed Vitals:     12/29/10 1200  12/29/10 1315  12/29/10 2052  12/30/10 0432   BP:  127/63  158/76  118/70  132/71   Pulse:  87  85  76  67   Temp:    98.6 F (37 C)  98.5 F (36.9 C)  98.6 F (37 C)   TempSrc:    Oral  Oral  Oral   Resp:  22  20  20  19    Height:    5\' 8"  (1.727 m)       Weight:    180 lb (81.647 kg)       SpO2:  100%  100%  100%  100%    Weight change:  Intake/Output Summary (Last 24 hours) at 12/30/10 1305 Last data filed at 12/30/10 0743   Gross per 24 hour   Intake    2280 ml   Output     500 ml   Net    1780 ml    Physical Exam: General: resting in bed, no acute distress HEENT: PERRL, EOMI, no scleral icterus, + conjunctival pallor Cardiac: RRR, no rubs, murmurs or gallops Pulm: clear to auscultation bilaterally, moving normal volumes of air Abd: soft, nontender, nondistended, BS normoactive, obese Ext: warm and well perfused, no pedal edema Neuro: alert and oriented X3, cranial nerves II-XII grossly intact, strength and sensation to light touch equal in bilateral upper  and lower extremities   Lab Results: Basic Metabolic Panel: Lab  12/30/10 1610  12/29/10 1358   NA  141  140   K  4.2  3.8   CL  112  109   CO2  22  21   GLUCOSE  79  114*   BUN  8  10   CREATININE  0.71  0.86   CALCIUM  8.4  8.8   MG  --  --   PHOS  --  --    Liver Function Tests: Lab  12/29/10 1358   AST  13   ALT  11   ALKPHOS  79   BILITOT  0.5   PROT  7.1   ALBUMIN  3.6    CBC: Lab  12/30/10 0505  12/29/10 2023  12/29/10 0726   WBC  4.4  6.2  --   NEUTROABS  --  --  4.0   HGB  4.6*  4.5*  --   HCT  17.8*  17.3*  --   MCV  56.5*  56.5*  --   PLT  249  264  --    Cardiac Enzymes: Lab  12/30/10 0505  12/29/10 2023  12/29/10 1405   CKTOTAL  55  69  67   CKMB  2.3  2.6  3.1   CKMBINDEX  --  --  --   TROPONINI  <0.30  0.34*  0.40*    D-Dimer: Lab  12/29/10 0726   DDIMER  0.30    Hemoglobin A1C: Lab  12/29/10 1358   HGBA1C  4.9    Fasting Lipid Panel: Lab  12/30/10 0505   CHOL  107   HDL  55   LDLCALC  42   TRIG  50   CHOLHDL  1.9   LDLDIRECT  --  Thyroid Function Tests: Lab  12/29/10 1358   TSH  1.950   T4TOTAL  --   FREET4  --   T3FREE  --   THYROIDAB  --    Coagulation: Lab  12/29/10 1358   LABPROT  15.3*   INR  1.18    Anemia Panel: Lab  12/29/10 0853   VITAMINB12  255   FOLATE  9.4   FERRITIN  3*   TIBC  Not calculated due to Iron <10.   IRON  <10*   RETICCTPCT  1.3    Urine Drug Screen: Drugs of Abuse      Component  Value  Date/Time     LABOPIA  NONE DETECTED  12/29/2010 0928     COCAINSCRNUR  NONE DETECTED  12/29/2010 0928     LABBENZ  NONE DETECTED  12/29/2010 0928     AMPHETMU  NONE DETECTED  12/29/2010 0928     THCU  NONE DETECTED  12/29/2010 0928     LABBARB  NONE DETECTED  12/29/2010 1610    Micro Results: No results found for this or any previous visit (from the past 240 hour(s)). Studies/Results: Dg Chest 2 View   12/29/2010  *RADIOLOGY REPORT*  Clinical Data: Chest pressure, shortness of breath.  CHEST -  2 VIEW  Comparison: None  Findings: Heart is mildly enlarged.  Lungs are clear.  No effusions or bony abnormality  IMPRESSION: Cardiomegaly.  No active disease.  Original Report Authenticated By: Cyndie Chime, M.D.    US Transvaginal Non-ob   12/29/2010  *RADIOLOGY REPORT*  Clinical Data: Dysmenorrhea and anemia  TRANSABDOMINAL AND TRANSVAGINAL ULTRASOUND OF PELVIS Technique:  Both transabdominal and transvaginal ultrasound examinations of the pelvis were performed. Transabdominal technique was performed for global imaging of the pelvis including uterus, ovaries, adnexal regions, and pelvic cul-de-sac.  Comparison: None.   It was necessary to proceed with endovaginal exam following the transabdominal exam to visualize the endometrium.  Findings:  The uterus is large and lobular in contour, measuring 16.1 x 9.0 x 11.1 cm.  There is an 8.6 cm fibroid in the anterior central fundus which displaces the endometrial stripe to the right of midline which suggests that this is myometrial rather than submucosal in location.  Endometrial stripe is not thickened, measuring approximately 10 mm.  Two smaller sub centimeter myometrial fibroids are noted at the anterior lower uterine segment.  Both ovaries have a normal size and appearance.  The right ovary measures 1.9 x 2.7 x 1.7 cm, and the left ovary measures 3.9 x 4.2 x 3.7 cm.  There are no adnexal masses or free pelvic fluid.  IMPRESSION: Fibroid uterus.  The largest fibroid measures 8.6 cm and is at the central fundus displacing endometrium to the right of midline which suggests that this is myometrial rather than submucosal.  Original Report Authenticated By: Brandon Melnick, M.D.    US Pelvis Complete   12/29/2010  *RADIOLOGY REPORT*  Clinical Data: Dysmenorrhea and anemia  TRANSABDOMINAL AND TRANSVAGINAL ULTRASOUND OF PELVIS Technique:  Both transabdominal and transvaginal ultrasound examinations of the pelvis were performed. Transabdominal technique was  performed for global imaging of the pelvis including uterus, ovaries, adnexal regions, and pelvic cul-de-sac.  Comparison: None.   It was necessary to proceed with endovaginal exam following the transabdominal exam to visualize the endometrium.  Findings:  The uterus is large and lobular in contour, measuring 16.1 x 9.0 x 11.1 cm.  There is an 8.6 cm fibroid in the anterior  central fundus which displaces the endometrial stripe to the right of midline which suggests that this is myometrial rather than submucosal in location.  Endometrial stripe is not thickened, measuring approximately 10 mm.  Two smaller sub centimeter myometrial fibroids are noted at the anterior lower uterine segment.  Both ovaries have a normal size and appearance.  The right ovary measures 1.9 x 2.7 x 1.7 cm, and the left ovary measures 3.9 x 4.2 x 3.7 cm.  There are no adnexal masses or free pelvic fluid.  IMPRESSION: Fibroid uterus.  The largest fibroid measures 8.6 cm and is at the central fundus displacing endometrium to the right of midline which suggests that this is myometrial rather than submucosal.  Original Report Authenticated By: Brandon Melnick, M.D.   Medications: I have reviewed the patient's current medications. Scheduled Meds:     .  ferumoxytol   1,020 mg  Intravenous  Once   .  sodium chloride   3 mL  Intravenous  Q12H    Continuous Infusions:     .  sodium chloride  100 mL/hr at 12/30/10 0630    Assessment/Plan: #Microcytic Iron deficiency Anemia:  Pts anemia is likely 2/2 menometrorrhagia and fibroids. She t is Jehovah's Witness, so refuses transfusion; Hb of 4.9 at admission now up to 5.5.  This is the reason for her SOB/chest pressure/palpitations.  She received one dose of IV iron yesterday without complication and will be due for second dose on 12/3.  She is amenable to starting OCPs for control of heavy and frequent menstruation until she is stable enough to pursue hysterectomy.   Plan:   - Pt will need  second dose of IV iron today (510mg  today) on 12/3 followed by PO supplementation. - Start  ferrous sulfate 325 tab tid starting Tuesday, she will need to increase frequency to 2-3 times per day - LDH, Haptoglobin & peripheral smear pending - Pt will need outpatient GYN consult for hysterectomy (US revealed fibroids) - Plan for OCP rx on d/c home - If patient remains stable and asymptomatic (No chest pain, tachycardia, syncope) for 24h will consider d/c home   #VTE Prophylaxis: SCDs     LOS: 2 days   Jaycelyn Orrison 12/31/2010, 1:00 PM

## 2011-01-01 ENCOUNTER — Inpatient Hospital Stay (HOSPITAL_COMMUNITY): Payer: Self-pay

## 2011-01-01 LAB — CBC
HCT: 20.1 % — ABNORMAL LOW (ref 36.0–46.0)
MCH: 14.9 pg — ABNORMAL LOW (ref 26.0–34.0)
MCHC: 25.9 g/dL — ABNORMAL LOW (ref 30.0–36.0)
MCV: 57.6 fL — ABNORMAL LOW (ref 78.0–100.0)
Platelets: 229 10*3/uL (ref 150–400)
RDW: 19.8 % — ABNORMAL HIGH (ref 11.5–15.5)
WBC: 5.5 10*3/uL (ref 4.0–10.5)

## 2011-01-01 MED ORDER — HYDROCODONE-ACETAMINOPHEN 5-325 MG PO TABS
1.0000 | ORAL_TABLET | ORAL | Status: DC | PRN
  Administered 2011-01-01 (×2): 2 via ORAL
  Filled 2011-01-01 (×3): qty 2

## 2011-01-01 MED ORDER — MORPHINE SULFATE 2 MG/ML IJ SOLN
INTRAMUSCULAR | Status: AC
  Filled 2011-01-01: qty 1

## 2011-01-01 MED ORDER — FERUMOXYTOL INJECTION 510 MG/17 ML
510.0000 mg | Freq: Once | INTRAVENOUS | Status: AC
  Administered 2011-01-02: 510 mg via INTRAVENOUS
  Filled 2011-01-01 (×2): qty 17

## 2011-01-01 MED ORDER — MORPHINE SULFATE 2 MG/ML IJ SOLN
2.0000 mg | Freq: Once | INTRAMUSCULAR | Status: AC
  Administered 2011-01-01: 2 mg via INTRAVENOUS

## 2011-01-01 MED ORDER — MEDROXYPROGESTERONE ACETATE 150 MG/ML IM SUSP
150.0000 mg | Freq: Once | INTRAMUSCULAR | Status: AC
  Administered 2011-01-01: 150 mg via INTRAMUSCULAR
  Filled 2011-01-01: qty 1

## 2011-01-01 NOTE — Progress Notes (Signed)
Subjective: Pt is feeling "ok" today.  She experienced one brief episode of SOB after accidentally choking on H20.  Also breifly tachy overnight to 120 on tele; pt asymptomatic.  She reports onset of right hip and leg pain that is constant, "deep" and 10/10 that began at rest.  She notes she often experiences pain in her prior to her menstrual cycle.  She denies any trauma, increased swelling, warmth, or redness of her right leg.  She is without any other complaint this morning. No acute events overnight per nursing.   Objective: Vital signs in last 24 hours: Filed Vitals:   12/31/10 0806 12/31/10 1338 12/31/10 2016 01/01/11 0416  BP: 131/80 119/64 129/61 130/61  Pulse: 75 64 81 68  Temp: 97 F (36.1 C) 97.8 F (36.6 C) 97.9 F (36.6 C) 98.4 F (36.9 C)  TempSrc: Oral Oral Oral Oral  Resp: 20 22 21 20   Height:      Weight:      SpO2: 100% 95% 100% 94%   Weight change:   Intake/Output Summary (Last 24 hours) at 01/01/11 1131 Last data filed at 12/31/10 1300  Gross per 24 hour  Intake    240 ml  Output      0 ml  Net    240 ml   Physical Exam: General: resting in bed, no acute distress but appears in moderate pain  HEENT: PERRL, EOMI, no scleral icterus, + conjunctival pallor  Cardiac: RRR, no rubs, murmurs or gallops  Pulm: clear to auscultation bilaterally, moving normal volumes of air  Abd: soft, nontender, nondistended, BS normoactive, obese  Ext: warm and well perfused, no increased warmth, erythema, or edema in RLE or LLE, knee ROM intact bilaterally, No TP Neuro: alert and oriented X3, cranial nerves II-XII grossly intact, strength and sensation to light touch equal in bilateral upper and lower extremities  Lab Results: Basic Metabolic Panel:  Lab 12/30/10 1610 12/29/10 1358  NA 141 140  K 4.2 3.8  CL 112 109  CO2 22 21  GLUCOSE 79 114*  BUN 8 10  CREATININE 0.71 0.86  CALCIUM 8.4 8.8  MG -- --  PHOS -- --   Liver Function Tests:  Lab 12/29/10 1358  AST 13   ALT 11  ALKPHOS 79  BILITOT 0.5  PROT 7.1  ALBUMIN 3.6   CBC:  Lab 01/01/11 0500 12/31/10 1000 12/29/10 0726  WBC 5.5 5.0 --  NEUTROABS -- -- 4.0  HGB 5.2* 5.5* --  HCT 20.1* 21.3* --  MCV 57.6* 56.6* --  PLT 229 290 --   Cardiac Enzymes:  Lab 12/30/10 0505 12/29/10 2023 12/29/10 1405  CKTOTAL 55 69 67  CKMB 2.3 2.6 3.1  CKMBINDEX -- -- --  TROPONINI <0.30 0.34* 0.40*   D-Dimer:  Lab 12/29/10 0726  DDIMER 0.30   CBG: No results found for this basename: GLUCAP:6 in the last 168 hours Hemoglobin A1C:  Lab 12/29/10 1358  HGBA1C 4.9   Fasting Lipid Panel:  Lab 12/30/10 0505  CHOL 107  HDL 55  LDLCALC 42  TRIG 50  CHOLHDL 1.9  LDLDIRECT --   Thyroid Function Tests:  Lab 12/29/10 1358  TSH 1.950  T4TOTAL --  FREET4 --  T3FREE --  THYROIDAB --   Coagulation:  Lab 12/29/10 1358  LABPROT 15.3*  INR 1.18   Anemia Panel:  Lab 12/29/10 0853  VITAMINB12 255  FOLATE 9.4  FERRITIN 3*  TIBC Not calculated due to Iron <10.  IRON <10*  RETICCTPCT 1.3   Urine Drug Screen: Drugs of Abuse     Component Value Date/Time   LABOPIA NONE DETECTED 12/29/2010 0928   COCAINSCRNUR NONE DETECTED 12/29/2010 0928   LABBENZ NONE DETECTED 12/29/2010 0928   AMPHETMU NONE DETECTED 12/29/2010 0928   THCU NONE DETECTED 12/29/2010 0928   LABBARB NONE DETECTED 12/29/2010 1610     Micro Results: Recent Results (from the past 240 hour(s))  TECHNOLOGIST SMEAR REVIEW     Status: Normal   Collection Time   12/30/10  1:37 PM      Component Value Range Status Comment   Path Review SPHEROCYTES   Final   I have reviewed the patient's current medications. Studies/Results: No results found. Medications:  Scheduled Meds:   . ferumoxytol  510 mg Intravenous Once  .  morphine injection  2 mg Intravenous Once  . morphine      . sodium chloride  3 mL Intravenous Q12H  . DISCONTD: ferumoxytol  510 mg Intravenous Once   Continuous Infusions:   . DISCONTD: sodium  chloride 100 mL/hr at 12/30/10 0630   PRN Meds:.HYDROcodone-acetaminophen Assessment/Plan:  #Anemia, microcytic: Pts anemia is likely 2/2 menometrorrhagia and fibroids. She is Jehovah's Witness, so refuses transfusion; Hb of 4.9 at admission now up to 5.5. This is the reason for her SOB/chest pressure/palpitations. Discussed various methods for contraception; pt is interested in using Depo-Provera injection to control her severe menometrorrhagia. - Give second dose of IV iron tomorrow morning prior to anticipated DC home - Give Depo-Provera 150 mg IM x1 today - Start ferrous sulfate 325 tab tid starting Tuesday, she will need to increase frequency to 2-3 times per day  - LDH, Haptoglobin & peripheral smear pending  - Pt will need outpatient GYN consult for hysterectomy (US revealed fibroids)  - If patient remains stable and asymptomatic plan for DC home tomorrow  #Right hip/leg pain: This is of unclear etiology may indeed be related to her menstrual cycles.  2 musculoskeletal strain is also possibility. DVT seems unlikely based on her examination findings, use SCDs, and that patient has remained mobile during her stay.  Would expect associated tingling/numbess if her symptoms were a result of superficial nerve compression secondary to her body habitus. - Vicodin for pain control - Will obtain hip/femur plan film  Dispo: D/C home in am after IV Fe given  #VTE Prophylaxis: SCDs    LOS: 3 days   Sendy Pluta 01/01/2011, 11:31 AM

## 2011-01-02 DIAGNOSIS — D649 Anemia, unspecified: Secondary | ICD-10-CM

## 2011-01-02 DIAGNOSIS — R000 Tachycardia, unspecified: Secondary | ICD-10-CM

## 2011-01-02 DIAGNOSIS — D509 Iron deficiency anemia, unspecified: Secondary | ICD-10-CM

## 2011-01-02 LAB — CBC
MCH: 15.3 pg — ABNORMAL LOW (ref 26.0–34.0)
MCHC: 25.8 g/dL — ABNORMAL LOW (ref 30.0–36.0)
Platelets: ADEQUATE 10*3/uL (ref 150–400)
RDW: 20.1 % — ABNORMAL HIGH (ref 11.5–15.5)

## 2011-01-02 MED ORDER — FERROUS SULFATE 325 (65 FE) MG PO TABS: 325.0000 mg | ORAL_TABLET | Freq: Three times a day (TID) | ORAL | Status: DC

## 2011-01-02 MED ORDER — MAGNESIUM HYDROXIDE 400 MG/5ML PO SUSP: 30.0000 mL | Freq: Every day | ORAL | Status: DC | PRN

## 2011-01-02 MED ORDER — HYDROCODONE-ACETAMINOPHEN 5-325 MG PO TABS: 1.0000 | ORAL_TABLET | ORAL | Status: AC | PRN

## 2011-01-02 NOTE — Discharge Summary (Signed)
Physician Discharge Summary  Patient ID: Tana Trefry MRN: [id number] DOB/AGE: 1965/04/10 45 y.o.  Admit date: 12/29/2010 Discharge date: 01/02/2011  Admission Diagnoses: Anemia, microcytic Sinus tachycardia Shortness of breath  Discharge Diagnoses:  Anemia, microcytic  Discharged Condition: good  Disposition and followup:  She is discharged home in improved condition. She will followup at the internal medicine clinic. At that time she will need CBC to evaluate her hemoglobin. She will also need referral to OB/GYN for hysterectomy; her anemia is the result of menometrorrhagia as well as fibroids.  She will also need to meet with Rudell Cobb and Dorothe Pea for financial assistance and to complete paperwork for her orange card.  Brief history of present illness: Patient is a 45 year old female with no known past medical history who presented with tachycardia, shortness of breath, and chest pressure that started at work on the day of admission.  On arrival to the emergency room, she was in sinus tachycardia with a heart rate in the 140s. CBC revealed hemoglobin at 4.6.  Hospital course by problem: 1. Anemia, microcytic: Patient was significantly anemic on arrival to the emergency room.  She reported a history of menometrorrhagia with periods occurring every 2 weeks that were very heavy.  FOBT was negative.  Pelvic and transvaginal ultrasound was obtained and revealed the presence of uterine fibroids likely contribute into her anemia. 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  She was monitored very closely and started on IV iron and she was iron deficient.  At the time of discharge her hemoglobin is improved to 5.5.  She'll be discharged home with instructions to complete iron supplementation and will be referred to OB/GYN to proceed with hysterectomy.  Additionally she was started on Depo-Provera to treat her menometrorrhagia  and minimize further blood loss.  2. Shortness of breath and chest pressure. This is likely secondary to her severe anemia. EKG was obtained that revealed sinus tachycardia without evidence of ischemia. Cardiac enzymes were cycled and remained negative.  Her symptoms were resolved at the time of discharge  Consults: none  Discharge Exam: Blood pressure 130/75, pulse 79, temperature 97.4 F (36.3 C), temperature source Oral, resp. rate 18, height 5\' 8"  (1.727 m), weight 180 lb (81.647 kg), last menstrual period 12/19/2010, SpO2 99.00%. VItal signs reviewed and stable. GEN: No apparent distress.  Alert and oriented x 3.  Pleasant, conversant, and cooperative to exam. HEENT: head is autraumatic and normocephalic.  Neck is supple without palpable masses or lymphadenopathy.  No JVD or carotid bruits.  Vision intact.  EOMI.  PERRLA.  Sclerae anicteric.  Conjunctivae with pallor, no injection. Mucous membranes are moist.  Oropharynx is without erythema, exudates, or other abnormal lesions.   RESP:  Lungs are clear to ascultation bilaterally with good air movement.  No wheezes, ronchi, or rubs. CARDIOVASCULAR: regular rate, normal rhythm.  Clear S1, S2, no murmurs, gallops, or rubs. ABDOMEN: soft, non-tender, non-distended.  Bowels sounds present in all quadrants and normoactive.  No palpable masses. EXT: warm and dry.  Peripheral pulses equal, intact, and +2 globally.  No clubbing or cyanosis.  No edema in bilateral lower extremities. SKIN: warm and dry with normal turgor.  No rashes or abnormal lesions observed. NEURO: CN II-XII grossly intact.  Muscle strength +5/5 in bilateral upper and lower extremities.  Sensation is grossly intact.  No focal deficit.    Discharge Orders    Future Orders Please Complete By Expires  Diet - low sodium heart healthy      Increase activity slowly      Discharge instructions      Comments:   Start taking iron pills as directed. Try over-the-counter MiraLax for  a laxative   Other Restrictions      Comments:   Do not return to full duties at work until your follow up appointment.  You may return to light duty.     Current Discharge Medication List    START taking these medications   Details  ferrous sulfate 325 (65 FE) MG tablet Take 1 tablet (325 mg total) by mouth 3 (three) times daily with meals. Qty: 90 tablet, Refills: 1    HYDROcodone-acetaminophen (NORCO) 5-325 MG per tablet Take 1-2 tablets by mouth every 4 (four) hours as needed. Qty: 30 tablet, Refills: 0      CONTINUE these medications which have NOT CHANGED   Details  ibuprofen (ADVIL,MOTRIN) 200 MG tablet Take 800-1,200 mg by mouth every 6 (six) hours as needed. For menstrual pain        Follow-up Information    Please follow up. (We will call you with an appointment date and time)    Contact information:   Internal medicine clinic at [facility] number: [phone]          Signed: Nelda Bucks 01/02/2011, 8:32 AM  This patient is NOT to continue taking NSAIDS. This may further exacerbate her anemia.

## 2011-01-02 NOTE — Progress Notes (Signed)
Subjective: Patient feels ok today. No more right hip or thigh pain after taking vicodin. States that"I feel good". Patient would like to go home today. Sleep well and no acute events reported per nursing. Pt reports that she want some laxatives and her last BM was 4 days ago. Objective: Vital signs in last 24 hours: Filed Vitals:   01/01/11 0416 01/01/11 1405 01/01/11 2052 01/02/11 0442  BP: 130/61 138/76 127/83 130/75  Pulse: 68 78 78 79  Temp: 98.4 F (36.9 C) 98 F (36.7 C) 98 F (36.7 C) 97.4 F (36.3 C)  TempSrc: Oral Oral Oral Oral  Resp: 20 20 20 18   Height:      Weight:      SpO2: 94% 100% 99% 99%   Weight change:   Intake/Output Summary (Last 24 hours) at 01/02/11 0622 Last data filed at 01/01/11 1300  Gross per 24 hour  Intake    720 ml  Output      0 ml  Net    720 ml   Physical Exam: Morbid obesity General: alert, well-developed, and cooperative to examination.  Head: normocephalic and atraumatic.  Eyes: vision grossly intact, pupils equal, pupils round, pupils reactive to light, no injection and anicteric.  Mouth: pharynx pink and moist, no erythema, and no exudates.  Neck: supple, full ROM, no thyromegaly, no JVD, and no carotid bruits.  Lungs: normal respiratory effort, no accessory muscle use, normal breath sounds, no crackles, and no wheezes. Heart: normal rate, regular rhythm, no murmur, no gallop, and no rub.  Abdomen: soft, non-tender, normal bowel sounds, no distention, no guarding, no rebound tenderness, no hepatomegaly, and no splenomegaly.  Msk: no joint swelling, no joint warmth, and no redness over joints.  Pulses: 2+ DP/PT pulses bilaterally Extremities: No cyanosis, clubbing, edema Neurologic: alert & oriented X3, cranial nerves II-XII intact, strength normal in all extremities, sensation intact to light touch, and gait normal.  Skin: turgor normal and no rashes.  Psych: Oriented X3, memory intact for recent and remote, normally interactive,  good eye contact, not anxious appearing, and not depressed appearing.   Lab Results: Basic Metabolic Panel:  Lab 12/30/10 7829 12/29/10 1358  Manly Nestle 141 140  K 4.2 3.8  CL 112 109  CO2 22 21  GLUCOSE 79 114*  BUN 8 10  CREATININE 0.71 0.86  CALCIUM 8.4 8.8  MG -- --  PHOS -- --   Liver Function Tests:  Lab 12/29/10 1358  AST 13  ALT 11  ALKPHOS 79  BILITOT 0.5  PROT 7.1  ALBUMIN 3.6   No results found for this basename: LIPASE:2,AMYLASE:2 in the last 168 hours No results found for this basename: AMMONIA:2 in the last 168 hours CBC:  Lab 01/01/11 0500 12/31/10 1000 12/29/10 0726  WBC 5.5 5.0 --  NEUTROABS -- -- 4.0  HGB 5.2* 5.5* --  HCT 20.1* 21.3* --  MCV 57.6* 56.6* --  PLT 229 290 --   Cardiac Enzymes:  Lab 12/30/10 0505 12/29/10 2023 12/29/10 1405  CKTOTAL 55 69 67  CKMB 2.3 2.6 3.1  CKMBINDEX -- -- --  TROPONINI <0.30 0.34* 0.40*   BNP: No results found for this basename: POCBNP:3 in the last 168 hours D-Dimer:  Lab 12/29/10 0726  DDIMER 0.30   CBG: No results found for this basename: GLUCAP:6 in the last 168 hours Hemoglobin A1C:  Lab 12/29/10 1358  HGBA1C 4.9   Fasting Lipid Panel:  Lab 12/30/10 0505  CHOL 107  HDL 55  LDLCALC 42  TRIG 50  CHOLHDL 1.9  LDLDIRECT --   Thyroid Function Tests:  Lab 12/29/10 1358  TSH 1.950  T4TOTAL --  FREET4 --  T3FREE --  THYROIDAB --   Coagulation:  Lab 12/29/10 1358  LABPROT 15.3*  INR 1.18   Anemia Panel:  Lab 12/29/10 0853  VITAMINB12 255  FOLATE 9.4  FERRITIN 3*  TIBC Not calculated due to Iron <10.  IRON <10*  RETICCTPCT 1.3   Urine Drug Screen: Drugs of Abuse     Component Value Date/Time   LABOPIA NONE DETECTED 12/29/2010 0928   COCAINSCRNUR NONE DETECTED 12/29/2010 0928   LABBENZ NONE DETECTED 12/29/2010 0928   AMPHETMU NONE DETECTED 12/29/2010 0928   THCU NONE DETECTED 12/29/2010 0928   LABBARB NONE DETECTED 12/29/2010 0928    Alcohol Level: No results found for  this basename: ETH:2 in the last 168 hours  Micro Results: Recent Results (from the past 240 hour(s))  TECHNOLOGIST SMEAR REVIEW     Status: Normal   Collection Time   12/30/10  1:37 PM      Component Value Range Status Comment   Path Review SPHEROCYTES   Final    Studies/Results: Dg Hip Complete Right  01/01/2011  *RADIOLOGY REPORT*  Clinical Data: Right hip pain.  RIGHT HIP - COMPLETE 2+ VIEW  Comparison: None  Findings: Both hips are normally located.  No acute bony findings or significant degenerative changes.  No plain film evidence of avascular necrosis.  Pubic symphysis and SI joints are intact.  No definite sacral or pubic rami fractures.  IMPRESSION: No acute bony findings.  Original Report Authenticated By: P. Loralie Champagne, M.D.   Dg Femur Right  01/01/2011  *RADIOLOGY REPORT*  Clinical Data: Right leg pain.  RIGHT FEMUR - 2 VIEW  Comparison: None  Findings: The hip and knee joints are maintained.  No femur fracture or destructive bony lesions.  No knee joint effusion is identified.  IMPRESSION: No acute bony findings.  Original Report Authenticated By: P. Loralie Champagne, M.D.   Medications:  Scheduled Meds:   . ferumoxytol  510 mg Intravenous Once  . medroxyPROGESTERone  150 mg Intramuscular Once  .  morphine injection  2 mg Intravenous Once  . morphine      . sodium chloride  3 mL Intravenous Q12H  . DISCONTD: ferumoxytol  510 mg Intravenous Once   Continuous Infusions:  PRN Meds:.HYDROcodone-acetaminophen Assessment/Plan: #Anemia, microcytic:  Pt presented with SOB/chest pressure/palpitations likely secondary to moderate to severe microcytic anemia, which is due to menometrorrhagia and fibroids. She has been treated with medical management including IV iron supplement during this admission with good response of Hb of 4.9 at admission now up to 5.2. She is Jehovah's Witness and refuses blood transfusion.   Patient agrees to use Depo-Provera injection to control her  severe menometrorrhagia for now and she will need to follow up GYN for the management of Uterus fibroids.  She had one dose of Depo-Provera 150 mg IM yesterday.   - will receive second dose of IV irontoday - Start ferrous sulfate 325 tab tid starting Tuesday, she will need to increase frequency to 2-3 times per day  -Haptoglobin 110 WNL,  LDH, peripheral smear pending  - Pt will need outpatient GYN consult for hysterectomy (US revealed fibroids)  - consider discharge today.  #Right hip/leg pain:  Patient had episodic right thigh and leg pain yesterday, which resolved with rest and pain medication. Denies numbness or tingling. The characteristic of  pain is consistent wit MSK stain/spasm which could be related to er menstrual cycle. No abnormality found on her hip and femur X ray. - will continue the pain management as needed   # Sickle cell trait, stable   Dispo: D/C home today. #VTE Prophylaxis: SCDs     LOS: 4 days   Verlyn Dannenberg 01/02/2011, 6:22 AM

## 2011-01-02 NOTE — Progress Notes (Signed)
Received order early in admission for assistance with meds at time of d/c, however d/c meds are over the counter and pain meds, neither of which case management is able to assist.  Johny Shock RN MPH Case manager [phone]

## 2011-01-03 ENCOUNTER — Telehealth: Payer: Self-pay | Admitting: Internal Medicine

## 2011-01-03 ENCOUNTER — Encounter: Payer: Self-pay | Admitting: Internal Medicine

## 2011-01-03 ENCOUNTER — Telehealth: Payer: Self-pay | Admitting: *Deleted

## 2011-01-03 NOTE — Telephone Encounter (Signed)
Letter faxed.

## 2011-01-03 NOTE — Telephone Encounter (Signed)
Called pt to inform her letter for return to work is written and will be faxed.  Also confirmed her appt date/time and encouraged her to be sure to f/u.

## 2011-01-03 NOTE — Telephone Encounter (Signed)
Pt needs return to work letter. Send to Mr Meyer Cory #  [phone]  HFU scheduled for Friday 11/7 with Dr Tonny Branch

## 2011-01-06 ENCOUNTER — Ambulatory Visit: Payer: Self-pay | Admitting: Internal Medicine

## 2011-01-11 ENCOUNTER — Encounter: Payer: Self-pay | Admitting: Internal Medicine

## 2011-01-11 ENCOUNTER — Ambulatory Visit (INDEPENDENT_AMBULATORY_CARE_PROVIDER_SITE_OTHER): Payer: Self-pay | Admitting: Internal Medicine

## 2011-01-11 DIAGNOSIS — R300 Dysuria: Secondary | ICD-10-CM

## 2011-01-11 DIAGNOSIS — N920 Excessive and frequent menstruation with regular cycle: Secondary | ICD-10-CM

## 2011-01-11 DIAGNOSIS — N921 Excessive and frequent menstruation with irregular cycle: Secondary | ICD-10-CM

## 2011-01-11 DIAGNOSIS — D219 Benign neoplasm of connective and other soft tissue, unspecified: Secondary | ICD-10-CM

## 2011-01-11 DIAGNOSIS — D259 Leiomyoma of uterus, unspecified: Secondary | ICD-10-CM

## 2011-01-11 DIAGNOSIS — D509 Iron deficiency anemia, unspecified: Secondary | ICD-10-CM

## 2011-01-11 LAB — CBC
HCT: 31.7 % — ABNORMAL LOW (ref 36.0–46.0)
MCHC: 27.4 g/dL — ABNORMAL LOW (ref 30.0–36.0)
Platelets: 392 10*3/uL (ref 150–400)
WBC: 7.6 10*3/uL (ref 4.0–10.5)

## 2011-01-11 NOTE — Patient Instructions (Signed)
1.  Stop in the lab to have your blood drawn to recheck your blood level.    2.  I will call you if there is anything that we need to follow up on the urine.  3.  Continue with your medications as prescribed.  4.  We will help you make an appointment with OB/GYN for consideration for surgery.  5.  Follow up in about 1 month to see how you are doing.  6.  Make sure you stop in to see the Financial counselor.  The information below is what she needs to complete your application.  Items required to complete an Eligibility Application   1. Picture ID (Can't be expired) 2. Current Bill to establish proof of residency 3. W-2 & Tax return (if self-employed include "Schedule C"), if not filing Form 4506 4. 4 current Pay stubs for this year 5. Printout of other income (Social security, unemployment, child support, workmen's comp) 6. Food stamp award letter, if receiving  7. Life Insurance (Need copy of the front page, showing name Ins Co. Name, and face amount). 8. Statement for pension, 401-K, IRS (needs to have current balance) 9. Tax Value for cars, houses, mobile homes, and land (Get from [facility] Tax Department) 10. Disability Paperwork (showing status of case) 11. College students: Print out of [place] received, tuition cost, books, etc. 12. If no Income: Engineer, maintenance of support for free shelter, money, food, Catering manager.  Bring all that you can to your follow up appointment to start the process.

## 2011-01-11 NOTE — Progress Notes (Signed)
Subjective:   Patient ID: [name] female   DOB: 25-Nov-1965 45 y.o.   MRN: [id number]  HPI: [name] is a 45 y.o. woman who presents to clinic today for HFU for microcytic anemia.  She states that has had periods every 2 weeks with heavy fluid for many years.  Transvaginal ultrasound done showed a 8.6 cm fibroid.  She was given Depo-provera in the hospital and states that she hasn't had a period since then.  Her previous schedule would have had her start her period about 3-4 days ago.  She states that she has had some vaginal pain for the last 4-5 days.  When asked she states that it only hurts when she urinates.  She states she urinates a lot usually anyway but hasn't had any increase in frequency.  She states that she only is able to void small amounts usually as well.   Past Medical History  Diagnosis Date  . Anemia   . Refusal of blood transfusions as patient is Jehovah's Witness 12/29/10  . Complication of anesthesia     not applicable; never had OR  . Dysrhythmia 12/29/10    "beating fast cause I don't have enough blood"  . Depression   . Postural dizziness 12/29/10    "cause my blood count is so low"   Current Outpatient Prescriptions  Medication Sig Dispense Refill  . ferrous sulfate 325 (65 FE) MG tablet Take 1 tablet (325 mg total) by mouth 3 (three) times daily with meals.  90 tablet  1  . HYDROcodone-acetaminophen (NORCO) 5-325 MG per tablet Take 1-2 tablets by mouth every 4 (four) hours as needed.  30 tablet  0  . ibuprofen (ADVIL,MOTRIN) 200 MG tablet Take 800-1,200 mg by mouth every 6 (six) hours as needed. For menstrual pain        No family history on file. History   Social History  . Marital Status: Single    Spouse Name: N/A    Number of Children: N/A  . Years of Education: N/A   Social History Main Topics  . Smoking status: Never Smoker   . Smokeless tobacco: Never Used  . Alcohol Use: Yes     "one mixed or cooler 2 X/month"  . Drug Use: No  .  Sexually Active: No   Other Topics Concern  . None   Social History Narrative   Patient is a Scientist, product/process development and refuses blood transfusion.   Review of Systems: Constitutional: Positive for postural dizziness. Denies fever, chills, diaphoresis, appetite change and fatigue.  HEENT: Denies photophobia, eye pain, redness, hearing loss, ear pain, congestion, sore throat, rhinorrhea, sneezing, mouth sores, trouble swallowing, neck pain, neck stiffness and tinnitus.   Respiratory: Denies SOB, DOE, cough, chest tightness,  and wheezing.   Cardiovascular: Denies chest pain, palpitations and leg swelling.  Gastrointestinal: Denies nausea, vomiting, abdominal pain, diarrhea, constipation, blood in stool and abdominal distention.  Genitourinary: Positive for dysuria, frequency.  Denies urgency, hematuria, flank pain and difficulty urinating.  Musculoskeletal: Denies myalgias, back pain, joint swelling, arthralgias and gait problem.  Skin: Denies pallor, rash and wound.  Neurological: Denies dizziness, seizures, syncope, weakness, light-headedness, numbness and headaches.  Hematological: Denies adenopathy. Easy bruising, personal or family bleeding history  Psychiatric/Behavioral: Denies suicidal ideation, mood changes, confusion, nervousness, sleep disturbance and agitation  Objective:  Physical Exam: Filed Vitals:   01/11/11 0928  BP: 132/73  Pulse: 62  Temp: 97.1 F (36.2 C)  TempSrc: Oral  Height: 5\' 6"  (1.676 m)  Weight: 291 lb (131.997 kg)   Constitutional: Vital signs reviewed.  Patient is an obese, well-developed, and well-nourished woman in no acute distress and cooperative with exam. Alert and oriented x3.  Head: Normocephalic and atraumatic Ear: TM normal bilaterally Mouth: no erythema or exudates, MMM Eyes: PERRL, EOMI, conjunctivae normal, No scleral icterus.  Neck: Supple, Trachea midline normal ROM, No JVD, mass, thyromegaly, or carotid bruit present.  Cardiovascular: RRR,  S1 normal, S2 normal, no MRG, pulses symmetric and intact bilaterally Pulmonary/Chest: CTAB, no wheezes, rales, or rhonchi Abdominal: Soft. Non-tender, non-distended, bowel sounds are normal, no masses, organomegaly, or guarding present.  GU: no CVA tenderness Hematology: no cervical, inginal, or axillary adenopathy.  Skin: Warm, dry and intact. No rash, cyanosis, or clubbing.  Psychiatric: Normal mood and affect. speech and behavior is normal. Judgment and thought content normal. Cognition and memory are normal.   Assessment & Plan:

## 2011-01-12 LAB — URINALYSIS, ROUTINE W REFLEX MICROSCOPIC
Glucose, UA: NEGATIVE mg/dL
Hgb urine dipstick: NEGATIVE
Leukocytes, UA: NEGATIVE
Nitrite: NEGATIVE
Protein, ur: NEGATIVE mg/dL
Urobilinogen, UA: 0.2 mg/dL (ref 0.0–1.0)

## 2011-01-27 ENCOUNTER — Telehealth: Payer: Self-pay | Admitting: Internal Medicine

## 2011-01-27 NOTE — Telephone Encounter (Signed)
Patient called to discuss recurrence of menstruation following administration of Depo-Provera injection during her recent hospitalization.  She states she is feeling well but is concerned because she has been bleeding for 9 days.  She denies any shortness of breath, chest palpitations, or any symptoms she experienced previously related to her anemia.  She inquires if she could receive an increased dose of Depo; informed patient that this is not typical and not the standard of care. Informed her everyone adjust to hormone contraceptive differently and that she may not experience amenorrhea after her initial dose on depo.  Additionally, the ultimate plan is referral to ob/gyn for probable hysterectomy as her anemia is the result of menometrorrhagia as well as uterine fibroids.  This referral was placed and her hospital follow up appointment.  In form to patient that there is nothing that can acutely be done to stop her vaginal bleeding and that she needs to follow up with gynecology to discuss treatment options for her menometrorrhagia/fibroids.   There is no urgent need for referral to GYN; unfortunately it maybe a few weeks to a month before she is able to be seen by a gynecologist.  Informed patient I would check on the status of her referral and that she should also call the clinic to followup on this. Advised her to go to the emergency room should she experience any shortness of breath, chest pain, heart palpitations, syncope, or any other symptoms that are concerning or similar to that she experienced previously.

## 2011-02-26 ENCOUNTER — Other Ambulatory Visit: Payer: Self-pay

## 2011-02-26 ENCOUNTER — Emergency Department (HOSPITAL_COMMUNITY)
Admission: EM | Admit: 2011-02-26 | Discharge: 2011-02-26 | Disposition: A | Discharge status: Home / Self Care | Payer: Self-pay | Attending: Emergency Medicine | Admitting: Emergency Medicine

## 2011-02-26 ENCOUNTER — Encounter (HOSPITAL_COMMUNITY): Payer: Self-pay | Admitting: Emergency Medicine

## 2011-02-26 ENCOUNTER — Emergency Department (HOSPITAL_COMMUNITY): Payer: Self-pay

## 2011-02-26 DIAGNOSIS — R0602 Shortness of breath: Secondary | ICD-10-CM | POA: Insufficient documentation

## 2011-02-26 DIAGNOSIS — R000 Tachycardia, unspecified: Secondary | ICD-10-CM | POA: Insufficient documentation

## 2011-02-26 DIAGNOSIS — F329 Major depressive disorder, single episode, unspecified: Secondary | ICD-10-CM | POA: Insufficient documentation

## 2011-02-26 DIAGNOSIS — F3289 Other specified depressive episodes: Secondary | ICD-10-CM | POA: Insufficient documentation

## 2011-02-26 LAB — URINALYSIS, ROUTINE W REFLEX MICROSCOPIC
Bilirubin Urine: NEGATIVE
Leukocytes, UA: NEGATIVE
Nitrite: NEGATIVE
Specific Gravity, Urine: 1.011 (ref 1.005–1.030)
Urobilinogen, UA: 0.2 mg/dL (ref 0.0–1.0)
pH: 5.5 (ref 5.0–8.0)

## 2011-02-26 LAB — POCT I-STAT, CHEM 8
BUN: 9 mg/dL (ref 6–23)
Calcium, Ion: 1.19 mmol/L (ref 1.12–1.32)
Chloride: 109 mEq/L (ref 96–112)
Glucose, Bld: 90 mg/dL (ref 70–99)
HCT: 44 % (ref 36.0–46.0)
TCO2: 23 mmol/L (ref 0–100)

## 2011-02-26 LAB — CBC
MCV: 84.9 fL (ref 78.0–100.0)
Platelets: 323 10*3/uL (ref 150–400)
RBC: 4.76 MIL/uL (ref 3.87–5.11)
RDW: 17.2 % — ABNORMAL HIGH (ref 11.5–15.5)
WBC: 6.4 10*3/uL (ref 4.0–10.5)

## 2011-02-26 LAB — POCT I-STAT TROPONIN I

## 2011-02-26 MED ORDER — IOHEXOL 350 MG/ML SOLN
75.0000 mL | Freq: Once | INTRAVENOUS | Status: AC | PRN
  Administered 2011-02-26: 75 mL via INTRAVENOUS

## 2011-02-26 MED ORDER — IOHEXOL 350 MG/ML SOLN
100.0000 mL | Freq: Once | INTRAVENOUS | Status: AC | PRN
  Administered 2011-02-26: 100 mL via INTRAVENOUS

## 2011-02-26 MED ORDER — DILTIAZEM HCL ER COATED BEADS 120 MG PO CP24: 120.0000 mg | ORAL_CAPSULE | Freq: Every day | ORAL | Status: DC

## 2011-02-26 NOTE — ED Provider Notes (Signed)
3:16 PM I spoke with Dr Anne Fu who is in agreement that patient should be seen by cardiology. Recommends a low dose of diltiazem for tachycardia. Also states patient should followup within week. States either his office or she may call the office scheduled the appointment. Patient and family agree on plan and are ready for discharge.  Rx for:  Diltiazem CD 120mg   1 po qd  #15    Thomasene Lot, PA-C 02/26/11 1517

## 2011-02-26 NOTE — ED Notes (Signed)
Pt reports feels like her heart is racing and is experiencing shortness of breath. Pt denies chest pain or N/V at this time. Pt reports that she was driving at onset.

## 2011-02-26 NOTE — ED Notes (Signed)
Pt's key given to security to move pt's car. Keys being returned to pt.

## 2011-02-26 NOTE — ED Provider Notes (Addendum)
History     CSN: [id number]  Arrival date & time 02/26/11  1124   First MD Initiated Contact with Patient 02/26/11 1138      Chief Complaint  Patient presents with  . Tachycardia  . Shortness of Breath   patient with a known history of symptomatic anemia, dysrhythmia, depression, known admission from November 29 to December 3 for anemia, tachycardia, shortness of breath, and chest pressure. [employer]. Patient states she only had one heavy menstrual cycle since that admission and that was at the end of last month. Today she was driving when she began to feel that her heart was racing and she became short of breath. She denies any chest pain, denies any pleuritic pain. She's had no fevers. Patient is a Scientist, product/process development. Denies any chance of being pregnant. She is currently on Depo-Provera and iron. Heart rate noted to be 131.States has chronic leg pain bilaterally, denies any unilateral calf pain.   (Consider location/radiation/quality/duration/timing/severity/associated sxs/prior treatment) HPI  Past Medical History  Diagnosis Date  . Anemia   . Refusal of blood transfusions as patient is Jehovah's Witness 12/29/10  . Complication of anesthesia     not applicable; never had OR  . Dysrhythmia 12/29/10    "beating fast cause I don't have enough blood"  . Depression   . Postural dizziness 12/29/10    "cause my blood count is so low"    Past Surgical History  Procedure Date  . No past surgeries     Family History  Problem Relation Age of Onset  . Breast cancer Maternal Grandmother 60    History  Substance Use Topics  . Smoking status: Never Smoker   . Smokeless tobacco: Never Used  . Alcohol Use: Yes     "one mixed or cooler 2 X/month"    OB History    Grav Para Term Preterm Abortions TAB SAB Ect Mult Living                  Review of Systems  All other systems reviewed and are negative.    Allergies  Review of patient's allergies indicates  no known allergies.  Home Medications   Current Outpatient Rx  Name Route Sig Dispense Refill  . FERROUS SULFATE 325 (65 FE) MG PO TABS Oral Take 1 tablet (325 mg total) by mouth 3 (three) times daily with meals. 90 tablet 1  . IBUPROFEN 200 MG PO TABS Oral Take 800-1,200 mg by mouth every 6 (six) hours as needed. For menstrual pain       BP 137/85  Pulse 133  Temp(Src) 97.9 F (36.6 C) (Oral)  Resp 32  SpO2 98%  Physical Exam  Nursing note and vitals reviewed. Constitutional: She is oriented to person, place, and time. She appears well-developed and well-nourished.       Morbidly obese, no diaphoresis. Uncomfortable, but in no acute distress.  HENT:  Head: Normocephalic and atraumatic.  Eyes: Conjunctivae and EOM are normal. Pupils are equal, round, and reactive to light.  Neck: Neck supple.  Cardiovascular: Regular rhythm.  Exam reveals no gallop and no friction rub.   No murmur heard.      Tachycardic rate, no murmur, rub, or gallop.  Pulmonary/Chest: Breath sounds normal. She has no wheezes. She has no rales. She exhibits no tenderness.  Abdominal: Soft. Bowel sounds are normal. She exhibits no distension. There is no tenderness. There is no rebound and no guarding.  Musculoskeletal: Normal range of motion.  She exhibits no edema and no tenderness.  Neurological: She is alert and oriented to person, place, and time. No cranial nerve deficit. Coordination normal.  Skin: Skin is warm and dry. No rash noted.  Psychiatric: She has a normal mood and affect.    ED Course  Procedures (including critical care time)   Labs Reviewed  CBC  I-STAT TROPONIN I  I-STAT, CHEM 8  URINALYSIS, ROUTINE W REFLEX MICROSCOPIC  POCT PREGNANCY, URINE   No results found.   No diagnosis found.    MDM  Patient is seen and examined, initial history and physical is completed. Evaluation initiated  Date: 02/26/2011  Rate: 131  Rhythm: sinus tachycardia  QRS Axis: normal  Intervals:  normal and non specific intraventricular block  ST/T Wave abnormalities: nonspecific ST/T changes  Conduction Disutrbances:none  Narrative Interpretation:   Old EKG Reviewed: changes noted     IV 02 continuous cardiac monitoring. IV fluids. We'll obtain stat portable chest x-ray, labs and reassess.   12:02 PM  Spontaneous conversion to normal sinus rhythm at this time. Remains stable. We'll change to formal PA and lateral chest x-ray and continue to follow  Macai Sisneros A. Patrica Duel, MD 02/26/11 1308   Results for orders placed during the hospital encounter of 02/26/11  CBC      Component Value Range   WBC 6.4  4.0 - 10.5 (K/uL)   RBC 4.76  3.87 - 5.11 (MIL/uL)   Hemoglobin 13.7  12.0 - 15.0 (g/dL)   HCT 09.8  11.9 - 14.7 (%)   MCV 84.9  78.0 - 100.0 (fL)   MCH 28.8  26.0 - 34.0 (pg)   MCHC 33.9  30.0 - 36.0 (g/dL)   RDW 82.9 (*) 56.2 - 15.5 (%)   Platelets 323  150 - 400 (K/uL)  URINALYSIS, ROUTINE W REFLEX MICROSCOPIC      Component Value Range   Color, Urine YELLOW  YELLOW    APPearance CLEAR  CLEAR    Specific Gravity, Urine 1.011  1.005 - 1.030    pH 5.5  5.0 - 8.0    Glucose, UA NEGATIVE  NEGATIVE (mg/dL)   Hgb urine dipstick TRACE (*) NEGATIVE    Bilirubin Urine NEGATIVE  NEGATIVE    Ketones, ur NEGATIVE  NEGATIVE (mg/dL)   Protein, ur NEGATIVE  NEGATIVE (mg/dL)   Urobilinogen, UA 0.2  0.0 - 1.0 (mg/dL)   Nitrite NEGATIVE  NEGATIVE    Leukocytes, UA NEGATIVE  NEGATIVE   POCT I-STAT TROPONIN I      Component Value Range   Troponin i, poc 0.04  0.00 - 0.08 (ng/mL)   Comment 3           POCT PREGNANCY, URINE      Component Value Range   Preg Test, Ur NEGATIVE  NEGATIVE   POCT I-STAT, CHEM 8      Component Value Range   Sodium 141  135 - 145 (mEq/L)   Potassium 4.2  3.5 - 5.1 (mEq/L)   Chloride 109  96 - 112 (mEq/L)   BUN 9  6 - 23 (mg/dL)   Creatinine, Ser 1.30  0.50 - 1.10 (mg/dL)   Glucose, Bld 90  70 - 99 (mg/dL)   Calcium, Ion 8.65  7.84 - 1.32 (mmol/L)    TCO2 23  0 - 100 (mmol/L)   Hemoglobin 15.0  12.0 - 15.0 (g/dL)   HCT 69.6  29.5 - 28.4 (%)  URINE MICROSCOPIC-ADD ON      Component Value  Range   Squamous Epithelial / LPF FEW (*) RARE    No results found.    Safal Halderman A. Patrica Duel, MD 02/26/11 1346   2:03 PM   Date: 02/26/2011  Rate: 74  Rhythm: normal sinus rhythm  QRS Axis: normal  Intervals: normal  ST/T Wave abnormalities: normal  Conduction Disutrbances:none  Narrative Interpretation:   Old EKG Reviewed: changes noted and LAA    Kalani Sthilaire A. Patrica Duel, MD 02/26/11 1406

## 2011-02-28 NOTE — ED Provider Notes (Signed)
Medical screening examination/treatment/procedure(s) were conducted as a shared visit with non-physician practitioner(s) and myself.  I personally evaluated the patient during the encounter   Sae Handrich A. Patrica Duel, MD 02/28/11 1450

## 2011-03-16 ENCOUNTER — Encounter: Payer: Self-pay | Admitting: Obstetrics and Gynecology

## 2011-03-16 ENCOUNTER — Ambulatory Visit (INDEPENDENT_AMBULATORY_CARE_PROVIDER_SITE_OTHER): Payer: Self-pay | Admitting: Obstetrics and Gynecology

## 2011-03-16 DIAGNOSIS — D259 Leiomyoma of uterus, unspecified: Secondary | ICD-10-CM

## 2011-03-16 DIAGNOSIS — D509 Iron deficiency anemia, unspecified: Secondary | ICD-10-CM

## 2011-03-16 DIAGNOSIS — N921 Excessive and frequent menstruation with irregular cycle: Secondary | ICD-10-CM

## 2011-03-16 DIAGNOSIS — N920 Excessive and frequent menstruation with regular cycle: Secondary | ICD-10-CM

## 2011-03-16 DIAGNOSIS — D219 Benign neoplasm of connective and other soft tissue, unspecified: Secondary | ICD-10-CM

## 2011-03-16 MED ORDER — HYDROCODONE-ACETAMINOPHEN 5-325 MG PO TABS: 1.0000 | ORAL_TABLET | Freq: Four times a day (QID) | ORAL | Status: DC | PRN

## 2011-03-16 MED ORDER — MEDROXYPROGESTERONE ACETATE 150 MG/ML IM SUSP
150.0000 mg | Freq: Once | INTRAMUSCULAR | Status: AC
  Administered 2011-03-16: 150 mg via INTRAMUSCULAR

## 2011-03-16 NOTE — Progress Notes (Signed)
Addended by: Catalina Antigua on: 03/16/2011 04:48 PM   Modules accepted: Orders

## 2011-03-16 NOTE — Progress Notes (Signed)
46 yo G1P1 presenting today for management of her fibroid uterus. Patient states that she was hospitalized in November for heavy vaginal bleeding due to severe anemia. Due to her religious beliefs, she refused blood transfusion and was treated with Depo-Provera. Since receiving depo-provera, patient reports some improvement in the amount of bleeding but states that she continues to bleed for 2-3 weeks out of the month with some days being only spotting.  Past Medical History  Diagnosis Date  . Anemia   . Refusal of blood transfusions as patient is Jehovah's Witness 12/29/10  . Complication of anesthesia     not applicable; never had OR  . Dysrhythmia 12/29/10    "beating fast cause I don't have enough blood"  . Depression   . Postural dizziness 12/29/10    "cause my blood count is so low"   Past Surgical History  Procedure Date  . No past surgeries   . Tooth extraction    Family History  Problem Relation Age of Onset  . Breast cancer Maternal Grandmother 60   History  Substance Use Topics  . Smoking status: Never Smoker   . Smokeless tobacco: Never Used  . Alcohol Use: Yes     "one mixed or cooler 2 X/month"   Patient declined physical exam as she was on her period  Ultrasound: Fibroid uterus. The largest fibroid measures 8.6 cm and is at the  central fundus displacing endometrium to the right of midline which  suggests that this is myometrial rather than submucosal.  A/P 46 yo G1P1 with enlarged fibroid uterus and abnormal uterine bleeding - Patient declined pap smear and endometrial biopsy due to her menses - Patient to receive another dose of depo-provera to help control her vaginal bleeding - patient to return next available for endometrial biopsy and surgical planning

## 2011-04-12 ENCOUNTER — Ambulatory Visit: Payer: Self-pay | Admitting: Obstetrics & Gynecology

## 2011-05-10 ENCOUNTER — Telehealth: Payer: Self-pay | Admitting: *Deleted

## 2011-05-10 NOTE — Telephone Encounter (Signed)
Pt left a message stating that she would like for Dr. Jolayne Panther to call her back. She states she has been bleeding for 5 weeks. Patient has a follow up appt scheduled with Dr. Debroah Loop for an endometrial biopsy on 05/18/11.

## 2011-05-10 NOTE — Telephone Encounter (Signed)
Spoke with patient she is changing her pad about every 3 hours, not saturated. She is aware of her appt next week with Dr. Debroah Loop and will keep that appt. She says that she is still having pain and wanted to know if Dr. Jolayne Panther would authorize a refill on her Vicodin. I advised her that I would check with Dr. Jolayne Panther and let her know. Pt voiced understanding. I also told her that if her bleeding becomes heavier and she is saturating a pad every hour then she needs to contact us or go to [facility] if after hours.

## 2011-05-11 ENCOUNTER — Other Ambulatory Visit: Payer: Self-pay | Admitting: Obstetrics and Gynecology

## 2011-05-11 MED ORDER — HYDROCODONE-ACETAMINOPHEN 5-325 MG PO TABS: 1.0000 | ORAL_TABLET | Freq: Four times a day (QID) | ORAL | Status: DC | PRN

## 2011-05-11 NOTE — Telephone Encounter (Signed)
Dr Jolayne Panther brought a prescription by for patient. I realized after Dr. Jolayne Panther left that she had printed script on plain paper instead of on rx paper. I telephoned the prescription in as written and informed patient to pick it up. Pt voiced understanding.

## 2011-05-17 ENCOUNTER — Telehealth: Payer: Self-pay | Admitting: *Deleted

## 2011-05-17 NOTE — Telephone Encounter (Signed)
Spoke with pt and pt wanted to know when does she receive her next depo injection.  I informed pt that her next depo, in which we scheduled, is for May 2 @ 1115am.  I advised pt that if she is just spotting to please to come to her appt so that she will not have to cancel her appt.  Also informing pt that hopefully her receiving her Depo on May 2 will help with stopping her period so that she will be able to do endo bx.  Pt stated understanding and had no further questions.

## 2011-05-17 NOTE — Telephone Encounter (Signed)
Pt left message that she has re-scheduled her biopsy appt for tomorrow and would like to speak to a nurse.

## 2011-05-18 ENCOUNTER — Ambulatory Visit: Payer: Self-pay | Admitting: Obstetrics & Gynecology

## 2011-06-01 ENCOUNTER — Ambulatory Visit: Payer: Self-pay

## 2011-06-15 ENCOUNTER — Other Ambulatory Visit (HOSPITAL_COMMUNITY)
Admission: RE | Admit: 2011-06-15 | Discharge: 2011-06-15 | Disposition: A | Discharge status: Home / Self Care | Payer: Self-pay | Source: Ambulatory Visit | Attending: Physician Assistant | Admitting: Physician Assistant

## 2011-06-15 ENCOUNTER — Ambulatory Visit (INDEPENDENT_AMBULATORY_CARE_PROVIDER_SITE_OTHER): Payer: Self-pay | Admitting: Physician Assistant

## 2011-06-15 ENCOUNTER — Encounter: Payer: Self-pay | Admitting: Physician Assistant

## 2011-06-15 VITALS — BP 132/76 | HR 92 | Temp 97.5°F | Ht 66.0 in | Wt 298.0 lb

## 2011-06-15 DIAGNOSIS — N938 Other specified abnormal uterine and vaginal bleeding: Secondary | ICD-10-CM | POA: Insufficient documentation

## 2011-06-15 DIAGNOSIS — Z01818 Encounter for other preprocedural examination: Secondary | ICD-10-CM

## 2011-06-15 DIAGNOSIS — N949 Unspecified condition associated with female genital organs and menstrual cycle: Secondary | ICD-10-CM | POA: Insufficient documentation

## 2011-06-15 DIAGNOSIS — Z3049 Encounter for surveillance of other contraceptives: Secondary | ICD-10-CM

## 2011-06-15 DIAGNOSIS — N926 Irregular menstruation, unspecified: Secondary | ICD-10-CM

## 2011-06-15 MED ORDER — MEDROXYPROGESTERONE ACETATE 150 MG/ML IM SUSP
150.0000 mg | Freq: Once | INTRAMUSCULAR | Status: AC
  Administered 2011-06-15: 150 mg via INTRAMUSCULAR

## 2011-06-15 NOTE — Progress Notes (Signed)
Patient states she needs a refill on her ferrous sulfate and norco.

## 2011-06-15 NOTE — Patient Instructions (Signed)
Endometrial Biopsy This is a test in which a tissue sample (a biopsy) is taken from inside the uterus (womb). It is then looked at by a specialist under a microscope to see if the tissue is normal or abnormal. The endometrium is the lining of the uterus. This test helps determine where you are in your menstrual cycle and how hormone levels are affecting the lining of the uterus. Another use for this test is to diagnose endometrial cancer, tuberculosis, polyps, or inflammatory conditions and to evaluate uterine bleeding. PREPARATION FOR TEST No preparation or fasting is necessary. NORMAL FINDINGS No pathologic conditions. Presence of "secretory-type" endometrium 3 to 5 days before to normal menstruation. Ranges for normal findings may vary among different laboratories and hospitals. You should always check with your doctor after having lab work or other tests done to discuss the meaning of your test results and whether your values are considered within normal limits. MEANING OF TEST  Your caregiver will go over the test results with you and discuss the importance and meaning of your results, as well as treatment options and the need for additional tests if necessary. OBTAINING THE TEST RESULTS It is your responsibility to obtain your test results. Ask the lab or department performing the test when and how you will get your results. Document Released: 05/19/2004 Document Revised: 01/05/2011 Document Reviewed: 12/27/2007 [employer] Patient Information 2012 [employer]. 

## 2011-06-15 NOTE — Progress Notes (Signed)
Patient given informed consent, signed copy in the chart, time out was performed. Appropriate time out taken. . The patient was placed in the lithotomy position and the cervix brought into view with sterile speculum.  Portio of cervix cleansed x 2 with betadine swabs.  A tenaculum was placed in the anterior lip of the cervix.  The uterus was sounded for depth of 10cm. A pipelle was introduced to into the uterus, suction created,  and an endometrial sample was obtained. All equipment was removed and accounted for.  The patient tolerated the procedure well.    Patient given post procedure instructions. Depo shot today. Will call with results.  Delbert Darley E. 06/15/2011 4:21 PM

## 2011-06-29 ENCOUNTER — Telehealth: Payer: Self-pay | Admitting: *Deleted

## 2011-06-29 NOTE — Telephone Encounter (Signed)
[name] called and left a message on voicemail stating would like results of biopsy or appointment for results.Also states when she had her appointment was told 2 prescriptions were sent to pharmacy and pharmacy said did not get them. Called [name] and infomred her results are back , but not reviewed by provider, will sent to provider for instruction. Also clarified prescriptions she wanted filled are Iron and pain med(hydrocodone) States only needs pain med when she has her period and has painful cramps. Informed her would send request to provider and notify her when filled . Patient voices understanding

## 2011-07-05 ENCOUNTER — Other Ambulatory Visit: Payer: Self-pay | Admitting: Physician Assistant

## 2011-07-05 DIAGNOSIS — R102 Pelvic and perineal pain: Secondary | ICD-10-CM

## 2011-07-05 MED ORDER — DICLOFENAC SODIUM 75 MG PO TBEC: 75.0000 mg | DELAYED_RELEASE_TABLET | Freq: Two times a day (BID) | ORAL | Status: DC

## 2011-07-05 NOTE — Telephone Encounter (Signed)
No longer needs iron based on last CBC, will sent Rx for Diclofenac, may not have additional percocet.

## 2011-07-05 NOTE — Telephone Encounter (Signed)
Called pt and informed pt that she does not need to continue to take iron supplement due to lab values and that a Rx for pain has been sent to her pharmacy that she may not have any additional percocet. I verified pharmacy with the pt.  Pt asked for biopsy results which were normal.   Pt stated understanding and having no further questions.

## 2011-08-02 ENCOUNTER — Ambulatory Visit (INDEPENDENT_AMBULATORY_CARE_PROVIDER_SITE_OTHER): Payer: Self-pay | Admitting: Obstetrics & Gynecology

## 2011-08-02 ENCOUNTER — Encounter: Payer: Self-pay | Admitting: Obstetrics & Gynecology

## 2011-08-02 VITALS — BP 139/80 | HR 77 | Ht 66.0 in | Wt 296.7 lb

## 2011-08-02 DIAGNOSIS — D219 Benign neoplasm of connective and other soft tissue, unspecified: Secondary | ICD-10-CM

## 2011-08-02 DIAGNOSIS — Z0000 Encounter for general adult medical examination without abnormal findings: Secondary | ICD-10-CM

## 2011-08-02 DIAGNOSIS — Z01419 Encounter for gynecological examination (general) (routine) without abnormal findings: Secondary | ICD-10-CM

## 2011-08-02 DIAGNOSIS — D259 Leiomyoma of uterus, unspecified: Secondary | ICD-10-CM

## 2011-08-02 MED ORDER — DICLOFENAC SODIUM 75 MG PO TBEC: 75.0000 mg | DELAYED_RELEASE_TABLET | Freq: Two times a day (BID) | ORAL | Status: AC

## 2011-08-02 NOTE — Progress Notes (Deleted)
Endometrial Biopsy Procedure Note  Pre-operative Diagnosis: DUB  Post-operative Diagnosis: same  Indications: abnormal uterine bleeding  Procedure Details   Urine pregnancy test was done  and result was negative.  The risks (including infection, bleeding, pain, and uterine perforation) and benefits of the procedure were explained to the patient and Written informed consent was obtained.  Antibiotic prophylaxis against endocarditis was not indicated.   The patient was placed in the dorsal lithotomy position.  Bimanual exam showed the uterus to be in the anteroflexed position.  A Graves' speculum inserted in the vagina, and the cervix prepped with povidone iodine.  Endocervical curettage with a Kevorkian curette was not performed.    A sterile uterine sound was used to sound the uterus to a depth of 10cm.  A Pipelle endometrial aspirator was used to sample the endometrium.  Sample was sent for pathologic examination.  Condition: Stable  Complications: None  Plan:  The patient was advised to call for any fever or for prolonged or severe pain or bleeding. She was advised to use nothing as needed for mild to moderate pain. She was advised to avoid vaginal intercourse for 48 hours or until the bleeding has completely stopped.  Attending Physician Documentation: I was present for or participated in the entire procedure, including opening and closing.

## 2011-08-02 NOTE — Progress Notes (Signed)
  Subjective:    Patient ID: [name], female    DOB: [birth year], 46 y.o.   MRN: [id number]  HPI  46 yo S AA G1P1 with a h/o heavy periods her whole life. She had an u/s that showed a 16 cm uterus with multiple fibroids.   Review of Systems  She says she uses abstinence for birth control.    Objective:   Physical Exam  Morbidly obese abdomen Bimanual exam reveals a 14 week size uterus      Assessment & Plan:  Symptomatic fibroids- I have offered to continue the depo provera since her hbg has improved with this treatment. I have also offered her depo Lupron. She is adament that she wants a hysterectomy. I have discussed her increased surgical morbidity related to her morbid obesity, and have told her that I am not comfortable operating on her. I

## 2011-08-17 ENCOUNTER — Ambulatory Visit: Payer: Self-pay

## 2011-09-04 ENCOUNTER — Ambulatory Visit: Payer: Self-pay | Admitting: Obstetrics & Gynecology

## 2011-09-07 ENCOUNTER — Ambulatory Visit: Payer: Self-pay | Admitting: Obstetrics & Gynecology

## 2011-09-08 ENCOUNTER — Encounter: Payer: Self-pay | Admitting: Obstetrics & Gynecology

## 2011-09-08 ENCOUNTER — Ambulatory Visit (INDEPENDENT_AMBULATORY_CARE_PROVIDER_SITE_OTHER): Payer: Self-pay | Admitting: Obstetrics & Gynecology

## 2011-09-08 VITALS — BP 136/80 | HR 88 | Temp 99.1°F | Resp 12 | Ht 66.0 in | Wt 299.7 lb

## 2011-09-08 DIAGNOSIS — D219 Benign neoplasm of connective and other soft tissue, unspecified: Secondary | ICD-10-CM

## 2011-09-08 DIAGNOSIS — N938 Other specified abnormal uterine and vaginal bleeding: Secondary | ICD-10-CM

## 2011-09-08 DIAGNOSIS — Z01419 Encounter for gynecological examination (general) (routine) without abnormal findings: Secondary | ICD-10-CM

## 2011-09-08 DIAGNOSIS — D259 Leiomyoma of uterus, unspecified: Secondary | ICD-10-CM

## 2011-09-08 DIAGNOSIS — N949 Unspecified condition associated with female genital organs and menstrual cycle: Secondary | ICD-10-CM

## 2011-09-08 DIAGNOSIS — Z0000 Encounter for general adult medical examination without abnormal findings: Secondary | ICD-10-CM

## 2011-09-08 MED ORDER — MEDROXYPROGESTERONE ACETATE 150 MG/ML IM SUSP
150.0000 mg | Freq: Once | INTRAMUSCULAR | Status: AC
  Administered 2011-09-08: 150 mg via INTRAMUSCULAR

## 2011-09-08 NOTE — Progress Notes (Signed)
Subjective:     Patient ID: [name], female   DOB: 1965/06/16, 46 y.o.   MRN: [id number]  HPI Pt for annual GYN exam. Pt with no menses now on Depo Provera.  Wants a hyst because 'a person shouldn't have to walk around with tumors in their body'    Review of Systems     Objective:   Physical ExamPt in NAD Lungs: CTA CV: RRR Abd: obese nontender, nondistended GU: EGBUS: no lesions Vagina: no blood in vault Cervix: no lesion; no mucopurulent d/c Uterus: enlarged ~15weeks sized. Adnexa: no masses palpable.  Exam difficult secondary to pts body habitus          Assessment:     Annual GYN exam Uterine fibroids- currently asymptomatic.  Controlled with Depo Provera.  Pt wants surgery     Plan:     F/u PAP and cx F/u screening mammogram Depo Provera 150mg  IM today  Perl Folmar L. Harraway-Smith, M.D., [place] [place]

## 2011-09-08 NOTE — Patient Instructions (Addendum)
Fibroids You have been diagnosed as having a fibroid. Fibroids are smooth muscle lumps (tumors) which can occur any place in a woman's body. They are usually in the womb (uterus). The most common problem (symptom) of fibroids is bleeding. Over time this may cause low red blood cells (anemia). Other symptoms include feelings of pressure and pain in the pelvis. The diagnosis (learning what is wrong) of fibroids is made by physical exam. Sometimes tests such as an ultrasound are used. This is helpful when fibroids are felt around the ovaries and to look for tumors. TREATMENT  Most fibroids do not need surgical or medical treatment.Mammogram Tips Healthy women should begin getting mammograms every year or two once they reach age 21, and once a year when they reach age 67. Here are tips: Find an experienced, high-volume center with accomplished radiologists. You can ask for their credentials.  Ask to see the certificate showing the center is approved by the U.S. Food and Drug Administration.  Use the same center regularly, so it is easier to compare your new mammograms with your old ones.  Bring a list of places you have had mammograms, dates, biopsies or other breast treatments. Bring old mammograms with you or have them sent to your primary caregiver.  Describe any breast problems to your caregiver or the person doing the mammogram. Be ready to give past surgeries, birth control pills, hormone use, breast implants, growths, moles, breast scars and family or personal history of breast cancer.  Call your doctor or center to check on the mammogram if you hear nothing within 10 days. Do not assume everything was normal.  To protect your privacy, the mammogram results cannot be given over the phone or to anyone but you.  Radiation from a mammogram is very low and does not pose a radiation risk.  Mammograms can detect breast problems other than breast cancer.  You may be asked stand or sit in front of the X-ray  machine.  Two small plastic or glass plates are placed around the breast when taking the X-ray.  If you are menstruating, schedule your mammogram a week after your menstrual period.  Do not wear deodorants, powder or perfume when getting a mammogram.  Wash your breasts and under your arms before getting a mammogram.  Wear cloths that are easy for you to undress and dress.  Arrive at the center at least 15 minutes before the mammogram is scheduled.  There may be slight discomfort during the mammogram, but it goes away shortly after the test.  Try to relax as much as possible during the mammogram.  Talk to your caregiver if you do not understand the results of the mammogram.  Follow the recommendations of your caregiver regarding further tests and treatments if needed.  Get a second opinion if you are concerned or question the results of the mammogram, further tests or treatment if needed.  Continue with monthly self-breast exams and yearly caregiver exams even if the mammogram is normal.  Your caregiver may recommend getting a mammogram before age 47 and more often if you are at high risk for developing breast cancer.  Document Released: 05/04/2005 Document Revised: 01/05/2011 Document Reviewed: 01/10/2008  [facility] Patient Information 2012 [place], [place]. Sometimes a tissue sample (biopsy) of the lining of the uterus is done to rule out cancer. If there is no cancer and only a small amount of bleeding, the problem can be watched.   Hormonal treatment can improve the problem.   When surgery  is needed, it can consist of removing the fibroid. Vaginal birth may not be possible after the removal of fibroids. This depends on where they are and the extent of surgery. When pregnancy occurs with fibroids it is usually normal.   Your caregiver can help decide which treatments are best for you.  HOME CARE INSTRUCTIONS   Do not use aspirin as this may increase bleeding problems.   If your periods  (menses) are heavy, record the number of pads or tampons used per month. Bring this information to your caregiver. This can help them determine the best treatment for you.  SEEK IMMEDIATE MEDICAL CARE IF:  You have pelvic pain or cramps not controlled with medications, or experience a sudden increase in pain.   You have an increase of pelvic bleeding between and during menses.   You feel lightheaded or have fainting spells.   You develop worsening belly (abdominal) pain.  Document Released: 01/14/2000 Document Revised: 01/05/2011 Document Reviewed: 09/05/2007 [facility] Patient Information 2012 [place], [place].

## 2011-11-30 ENCOUNTER — Ambulatory Visit (INDEPENDENT_AMBULATORY_CARE_PROVIDER_SITE_OTHER): Payer: Self-pay | Admitting: Medical

## 2011-11-30 VITALS — BP 136/72 | HR 73 | Resp 12

## 2011-11-30 DIAGNOSIS — N920 Excessive and frequent menstruation with regular cycle: Secondary | ICD-10-CM

## 2011-11-30 DIAGNOSIS — N921 Excessive and frequent menstruation with irregular cycle: Secondary | ICD-10-CM

## 2011-11-30 MED ORDER — MEDROXYPROGESTERONE ACETATE 150 MG/ML IM SUSP
150.0000 mg | INTRAMUSCULAR | Status: DC
  Administered 2011-11-30: 150 mg via INTRAMUSCULAR

## 2012-02-22 ENCOUNTER — Ambulatory Visit (INDEPENDENT_AMBULATORY_CARE_PROVIDER_SITE_OTHER): Payer: Self-pay | Admitting: General Practice

## 2012-02-22 VITALS — BP 137/88 | HR 83 | Temp 98.5°F | Ht 66.0 in | Wt 295.1 lb

## 2012-02-22 DIAGNOSIS — N949 Unspecified condition associated with female genital organs and menstrual cycle: Secondary | ICD-10-CM

## 2012-02-22 DIAGNOSIS — N938 Other specified abnormal uterine and vaginal bleeding: Secondary | ICD-10-CM

## 2012-02-22 MED ORDER — MEDROXYPROGESTERONE ACETATE 150 MG/ML IM SUSP
150.0000 mg | Freq: Once | INTRAMUSCULAR | Status: AC
  Administered 2012-02-22: 150 mg via INTRAMUSCULAR

## 2012-05-09 ENCOUNTER — Ambulatory Visit: Payer: Self-pay

## 2012-05-30 ENCOUNTER — Encounter: Payer: Self-pay | Admitting: Obstetrics & Gynecology

## 2012-05-30 ENCOUNTER — Ambulatory Visit (INDEPENDENT_AMBULATORY_CARE_PROVIDER_SITE_OTHER): Payer: Self-pay | Admitting: Obstetrics & Gynecology

## 2012-05-30 VITALS — BP 131/79 | HR 77 | Temp 97.1°F | Ht 67.0 in | Wt 288.6 lb

## 2012-05-30 DIAGNOSIS — N920 Excessive and frequent menstruation with regular cycle: Secondary | ICD-10-CM

## 2012-05-30 DIAGNOSIS — D219 Benign neoplasm of connective and other soft tissue, unspecified: Secondary | ICD-10-CM

## 2012-05-30 DIAGNOSIS — D259 Leiomyoma of uterus, unspecified: Secondary | ICD-10-CM

## 2012-05-30 MED ORDER — NORGESTIMATE-ETH ESTRADIOL 0.25-35 MG-MCG PO TABS: 1.0000 | ORAL_TABLET | Freq: Every day | ORAL | Status: DC

## 2012-05-30 NOTE — Progress Notes (Signed)
Subjective:     Patient ID: [name], female   DOB: 1965/11/05, 47 y.o.   MRN: [id number]  HPI Pt c/o 6 months of uterine bleeding.  She reports that she has been on Depo Provera with control of her cycles but, now she has been bleeding.  Reports that 'I want a hysterectomy but, they will not give one to me.'  She is concerned about continuing to take the Depo Provera.    She had an endobx 06/13/2011 and does not feel prepared to have one today  Past Medical History  Diagnosis Date  . Anemia   . Refusal of blood transfusions as patient is Jehovah's Witness 12/29/10  . Complication of anesthesia     not applicable; never had OR  . Dysrhythmia 12/29/10    "beating fast cause I don't have enough blood"  . Depression   . Postural dizziness 12/29/10    "cause my blood count is so low"  . Morbid obesity     BMI 47   Past Surgical History  Procedure Laterality Date  . No past surgeries    . Tooth extraction     Current Outpatient Prescriptions on File Prior to Visit  Medication Sig Dispense Refill  . aspirin 81 MG tablet Take 160 mg by mouth once.      . diclofenac (VOLTAREN) 75 MG EC tablet Take 1 tablet (75 mg total) by mouth 2 (two) times daily with a meal.  60 tablet  2  . diltiazem (CARDIZEM CD) 120 MG 24 hr capsule Take 1 capsule (120 mg total) by mouth daily.  15 capsule  0  . ferrous sulfate 325 (65 FE) MG tablet Take 325 mg by mouth 3 (three) times daily with meals.      Marland Kitchen HYDROcodone-acetaminophen (NORCO) 5-325 MG per tablet Take 1 tablet by mouth every 6 (six) hours as needed.  30 tablet  0  . ibuprofen (ADVIL,MOTRIN) 200 MG tablet Take 800 mg by mouth every 6 (six) hours as needed.      . medroxyPROGESTERone (DEPO-PROVERA) 150 MG/ML injection Inject 150 mg into the muscle every 3 (three) months.      Marland Kitchen OVER THE COUNTER MEDICATION Take 2 capsules by mouth daily. Raspberry Ketones       No current facility-administered medications on file prior to visit.   No Known  Allergies History   Social History  . Marital Status: Single    Spouse Name: N/A    Number of Children: N/A  . Years of Education: N/A   Occupational History  . Not on file.   Social History Main Topics  . Smoking status: Never Smoker   . Smokeless tobacco: Never Used  . Alcohol Use: No     Comment: "one mixed or cooler 2 X/month"  . Drug Use: No  . Sexually Active: No   Other Topics Concern  . 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       Review of Systems     Objective:   Physical Exam BP 131/79  Pulse 77  Temp(Src) 97.1 F (36.2 C) (Oral)  Ht 5\' 7"  (1.702 m)  Wt 288 lb 9.6 oz (130.908 kg)  BMI 45.19 kg/m2  LMP 12/31/2011 Pt in NAD  Last endo bx 05/2011: neg sono 12/29/2010 Clinical Data: Dysmenorrhea and anemia  TRANSABDOMINAL AND TRANSVAGINAL ULTRASOUND OF PELVIS  Technique: Both transabdominal and transvaginal ultrasound  examinations of the pelvis  were performed. Transabdominal technique  was performed for global imaging of the pelvis including uterus,  ovaries, adnexal regions, and pelvic cul-de-sac.  Comparison: None.  It was necessary to proceed with endovaginal exam following the  transabdominal exam to visualize the endometrium.  Findings:  The uterus is large and lobular in contour, measuring 16.1 x 9.0 x  11.1 cm. There is an 8.6 cm fibroid in the anterior central fundus  which displaces the endometrial stripe to the right of midline  which suggests that this is myometrial rather than submucosal in  location. Endometrial stripe is not thickened, measuring  approximately 10 mm. Two smaller sub centimeter myometrial  fibroids are noted at the anterior lower uterine segment.  Both ovaries have a normal size and appearance. The right ovary  measures 1.9 x 2.7 x 1.7 cm, and the left ovary measures 3.9 x 4.2  x 3.7 cm. There are no adnexal masses or free pelvic fluid.  IMPRESSION:   Fibroid uterus. The largest fibroid measures 8.6 cm and is at the  central fundus displacing endometrium to the right of midline which  suggests that this is myometrial rather than submucosal.      Assessment:     Uterine fibroids symptomatic- causing irregular heavy bleeding.  Pt wants definitive tx with a hysterectomy.  D/w patient that prior to scheduling any procedure she will need a endo bx and a repeat sono (last one in 2012).  She opted o have her exam on the day of the endobx          Plan:     Pelvic sono F/u for endobx and pelvic exam  Pt counseled to complete financial aid paperwork and told that her surgery could not be scheduled until that was completed. sprintec 1 po q day (no contraindications)

## 2012-05-30 NOTE — Patient Instructions (Signed)
Menorrhagia Dysfunctional uterine bleeding is different from a normal menstrual period. When periods are heavy or there is more bleeding than is usual for you, it is called menorrhagia. It may be caused by hormonal imbalance, or physical, metabolic, or other problems. Examination is necessary in order that your caregiver may treat treatable causes. If this is a continuing problem, a D&C may be needed. That means that the cervix (the opening of the uterus or womb) is dilated (stretched larger) and the lining of the uterus is scraped out. The tissue scraped out is then examined under a microscope by a specialist (pathologist) to make sure there is nothing of concern that needs further or more extensive treatment. HOME CARE INSTRUCTIONS   If medications were prescribed, take exactly as directed. Do not change or switch medications without consulting your caregiver.  Long term heavy bleeding may result in iron deficiency. Your caregiver may have prescribed iron pills. They help replace the iron your body lost from heavy bleeding. Take exactly as directed. Iron may cause constipation. If this becomes a problem, increase the bran, fruits, and roughage in your diet.  Do not take aspirin or medicines that contain aspirin one week before or during your menstrual period. Aspirin may make the bleeding worse.  If you need to change your sanitary pad or tampon more than once every 2 hours, stay in bed and rest as much as possible until the bleeding stops.  Eat well-balanced meals. Eat foods high in iron. Examples are leafy green vegetables, meat, liver, eggs, and whole grain breads and cereals. Do not try to lose weight until the abnormal bleeding has stopped and your blood iron level is back to normal. SEEK MEDICAL CARE IF:   You need to change your sanitary pad or tampon more than once an hour.  You develop nausea (feeling sick to your stomach) and vomiting, dizziness, or diarrhea while you are taking your  medicine.  You have any problems that may be related to the medicine you are taking. SEEK IMMEDIATE MEDICAL CARE IF:   You have a fever.  You develop chills.  You develop severe bleeding or start to pass blood clots.  You feel dizzy or faint. MAKE SURE YOU:   Understand these instructions.  Will watch your condition.  Will get help right away if you are not doing well or get worse. Document Released: 01/16/2005 Document Revised: 04/10/2011 Document Reviewed: 09/06/2007 [employer] Patient Information 2013 [employer]. Uterine Fibroid A uterine fibroid is a growth (tumor) that occurs in a woman's uterus. This type of tumor is not cancerous and does not spread out of the uterus. A woman can have one or many fibroids, and the fiboid(s) can become quite large. A fibroid can vary in size, weight, and where it grows in the uterus. Most fibroids do not require medical treatment, but some can cause pain or heavy bleeding during and between periods. CAUSES  A fibroid is the result of a single uterine cell that keeps growing (unregulated), which is different than most cells in the human body. Most cells have a control mechanism that keeps them from reproducing without control.  SYMPTOMS   Bleeding.  Pelvic pain and pressure.  Bladder problems due to the size of the fibroid.  Infertility and miscarriages depending on the size and location of the fibroid. DIAGNOSIS  A diagnosis is made by physical exam. Your caregiver may feel the lumpy tumors during a pelvic exam. Important information regarding size, location, and number of tumors   can be gained by having an ultrasound. It is rare that other tests, such as a CT scan or MRI, are needed. TREATMENT   Your caregiver may recommend watchful waiting. This involves getting the fibroid checked by your caregiver to see if the fibroids grow or shrink.   Hormonal treatment or an intrauterine device (IUD) may be prescribed.   Surgery may be  needed to remove the fibroids (myomectomy) or the uterus (hysterectomy). This depends on your situation. When fibroids interfere with fertility and a woman wants to become pregnant, a caregiver may recommend having the fibroids removed.  HOME CARE INSTRUCTIONS  Home care depends on how you were treated. In general:   Keep all follow-up appointments with your caregiver.   Only take medicine as told by your caregiver. Do not take aspirin. It can cause bleeding.   If you have excessive periods and soak tampons or pads in a half hour or less, contact your caregiver immediately. If your periods are troublesome but not so heavy, lie down with your feet raised slightly above your heart. Place cold packs on your lower abdomen.   If your periods are heavy, write down the number of pads or tampons you use per month. Bring this information to your caregiver.   Talk to your caregiver about taking iron pills.   Include green vegetables in your diet.   If you were prescribed a hormonal treatment, take the hormonal medicines as directed.   If you need surgery, ask your caregiver for information on your specific surgery.  SEEK IMMEDIATE MEDICAL CARE IF:  You have pelvic pain or cramps not controlled with medicines.   You have a sudden increase in pelvic pain.   You have an increase of bleeding between and during periods.   You feel lightheaded or have fainting episodes.  MAKE SURE YOU:  Understand these instructions.  Will watch your condition.  Will get help right away if you are not doing well or get worse. Document Released: 01/14/2000 Document Revised: 04/10/2011 Document Reviewed: 02/06/2011 Exit[employer] Patient Information 2013 [employer].  

## 2012-06-13 ENCOUNTER — Ambulatory Visit (HOSPITAL_COMMUNITY)
Admission: RE | Admit: 2012-06-13 | Discharge: 2012-06-13 | Disposition: A | Discharge status: Home / Self Care | Payer: Self-pay | Source: Ambulatory Visit | Attending: Obstetrics & Gynecology | Admitting: Obstetrics & Gynecology

## 2012-06-13 DIAGNOSIS — D250 Submucous leiomyoma of uterus: Secondary | ICD-10-CM | POA: Insufficient documentation

## 2012-06-13 DIAGNOSIS — D219 Benign neoplasm of connective and other soft tissue, unspecified: Secondary | ICD-10-CM

## 2012-06-13 DIAGNOSIS — N920 Excessive and frequent menstruation with regular cycle: Secondary | ICD-10-CM | POA: Insufficient documentation

## 2012-06-13 DIAGNOSIS — N852 Hypertrophy of uterus: Secondary | ICD-10-CM | POA: Insufficient documentation

## 2012-06-27 ENCOUNTER — Ambulatory Visit (INDEPENDENT_AMBULATORY_CARE_PROVIDER_SITE_OTHER): Payer: Self-pay | Admitting: Obstetrics & Gynecology

## 2012-06-27 ENCOUNTER — Encounter: Payer: Self-pay | Admitting: Obstetrics & Gynecology

## 2012-06-27 ENCOUNTER — Other Ambulatory Visit (HOSPITAL_COMMUNITY)
Admission: RE | Admit: 2012-06-27 | Discharge: 2012-06-27 | Disposition: A | Discharge status: Home / Self Care | Payer: Self-pay | Source: Ambulatory Visit | Attending: Obstetrics & Gynecology | Admitting: Obstetrics & Gynecology

## 2012-06-27 VITALS — BP 132/85 | HR 78 | Temp 97.6°F | Ht 66.0 in | Wt 287.8 lb

## 2012-06-27 DIAGNOSIS — N949 Unspecified condition associated with female genital organs and menstrual cycle: Secondary | ICD-10-CM | POA: Insufficient documentation

## 2012-06-27 DIAGNOSIS — N938 Other specified abnormal uterine and vaginal bleeding: Secondary | ICD-10-CM

## 2012-06-27 NOTE — Progress Notes (Signed)
Patient ID: [name], female   DOB: Oct 21, 1965, 47 y.o.   MRN: [id number] The indications for endometrial biopsy were reviewed.   Risks of the biopsy including cramping, bleeding, infection, uterine perforation, inadequate specimen and need for additional procedures  were discussed. The patient states she understands and agrees to undergo procedure today. Consent was signed. Time out was performed. Urine HCG was negative. A sterile speculum was placed in the patient's vagina and the cervix was prepped with Betadine. A single-toothed tenaculum was placed on the anterior lip of the cervix to stabilize it. [place] has a very deep vagina. The 3 mm pipelle was introduced into the endometrial cavity without difficulty to a depth of 12cm, and a moderate amount of tissue was obtained and sent to pathology. The instruments were removed from the patient's vagina. Minimal bleeding from the cervix was noted. The patient tolerated the procedure well. Routine post-procedure instructions were given to the patient. The patient will follow up to review the results and for further management.    Pt will continue OCP's.  She has been on them for 1 month and has had no bleeding. F/u 3-6 months Will have results called Pt to complete her financial aid paperwork and bring it back in

## 2012-06-27 NOTE — Patient Instructions (Addendum)

## 2013-05-09 ENCOUNTER — Telehealth: Payer: Self-pay | Admitting: *Deleted

## 2013-05-09 DIAGNOSIS — N920 Excessive and frequent menstruation with regular cycle: Secondary | ICD-10-CM

## 2013-05-09 DIAGNOSIS — D219 Benign neoplasm of connective and other soft tissue, unspecified: Secondary | ICD-10-CM

## 2013-05-09 MED ORDER — NORGESTIMATE-ETH ESTRADIOL 0.25-35 MG-MCG PO TABS: 1.0000 | ORAL_TABLET | Freq: Every day | ORAL | Status: DC

## 2013-05-09 NOTE — Telephone Encounter (Signed)
Pt left message stating that she has run out of her birth control and it is the only thing that keeps her from bleeding. I reviewed chart and returned the call. I explained that I can refill her birth control for 3 months and she will need appt in the office for follow up. She will get additional refills at the time of her appt based on the doctor's evaluation. She was last seen on 06/27/12 and we need to evaluate her condition and plan of care. Pt voiced understanding and agreed to appt on 5/7 @ 1245.

## 2013-06-05 ENCOUNTER — Ambulatory Visit (INDEPENDENT_AMBULATORY_CARE_PROVIDER_SITE_OTHER): Payer: Self-pay | Admitting: Obstetrics & Gynecology

## 2013-06-05 ENCOUNTER — Encounter: Payer: Self-pay | Admitting: Obstetrics & Gynecology

## 2013-06-05 VITALS — BP 103/64 | HR 93 | Temp 97.1°F | Ht 66.0 in | Wt 290.7 lb

## 2013-06-05 DIAGNOSIS — D219 Benign neoplasm of connective and other soft tissue, unspecified: Secondary | ICD-10-CM

## 2013-06-05 DIAGNOSIS — N920 Excessive and frequent menstruation with regular cycle: Secondary | ICD-10-CM

## 2013-06-05 DIAGNOSIS — D259 Leiomyoma of uterus, unspecified: Secondary | ICD-10-CM

## 2013-06-05 MED ORDER — NORGESTIMATE-ETH ESTRADIOL 0.25-35 MG-MCG PO TABS: 1.0000 | ORAL_TABLET | Freq: Every day | ORAL | Status: DC

## 2013-06-05 NOTE — Progress Notes (Signed)
Information given about free pap screening.  Pt prescription for birth control pills about to expire. Pt states pain/bleeding from fibroids has improved since taking the pills.

## 2013-06-05 NOTE — Patient Instructions (Signed)
Uterine Fibroid A uterine fibroid is a growth (tumor) that occurs in your uterus. This type of tumor is not cancerous and does not spread out of the uterus. You can have one or many fibroids. Fibroids can vary in size, weight, and where they grow in the uterus. Some can become quite large. Most fibroids do not require medical treatment, but some can cause pain or heavy bleeding during and between periods. CAUSES  A fibroid is the result of a single uterine cell that keeps growing (unregulated), which is different than most cells in the human body. Most cells have a control mechanism that keeps them from reproducing without control.  SIGNS AND SYMPTOMS   Bleeding.  Pelvic pain and pressure.  Bladder problems due to the size of the fibroid.  Infertility and miscarriages depending on the size and location of the fibroid. DIAGNOSIS  Uterine fibroids are diagnosed through a physical exam. Your health care provider may feel the lumpy tumors during a pelvic exam. Ultrasonography may be done to get information regarding size, location, and number of tumors.  TREATMENT   Your health care provider may recommend watchful waiting. This involves getting the fibroid checked by your health care provider to see if it grows or shrinks.   Hormone treatment or an intrauterine device (IUD) may be prescribed.   Surgery may be needed to remove the fibroids (myomectomy) or the uterus (hysterectomy). This depends on your situation. When fibroids interfere with fertility and a woman wants to become pregnant, a health care provider may recommend having the fibroids removed.  [place] care depends on how you were treated. In general:   Keep all follow-up appointments with your health care provider.   Only take over-the-counter or prescription medicines as directed by your health care provider. If you were prescribed a hormone treatment, take the hormone medicines exactly as directed. Do not  take aspirin. It can cause bleeding.   Talk to your health care provider about taking iron pills.  If your periods are troublesome but not so heavy, lie down with your feet raised slightly above your heart. Place cold packs on your lower abdomen.   If your periods are heavy, write down the number of pads or tampons you use per month. Bring this information to your health care provider.   Include green vegetables in your diet.  SEEK IMMEDIATE MEDICAL CARE IF:  You have pelvic pain or cramps not controlled with medicines.   You have a sudden increase in pelvic pain.   You have an increase in bleeding between and during periods.   You have excessive periods and soak tampons or pads in a half hour or less.  You feel lightheaded or have fainting episodes. Document Released: 01/14/2000 Document Revised: 11/06/2012 Document Reviewed: 08/15/2012 [facility] Patient Information 2014 [place], [place].

## 2013-06-05 NOTE — Progress Notes (Signed)
Subjective:     Patient ID: [name], female   DOB: 08-Oct-1965, 48 y.o.   MRN: [id number]  HPIPt presents for refill of her OCP's.  She has been on OCP's for menorrhagia due to uterine fibroids.  Her Endo bx was negative 05/2012.  Her bleeding has been well controlled on OPC's.  Pt has never had a mammorgam.    Review of Systems     Objective:   Physical Exam BP 103/64  Pulse 93  Temp(Src) 97.1 F (36.2 C)  Ht 5\' 6"  (1.676 m)  Wt 290 lb 11.2 oz (131.861 kg)  BMI 46.94 kg/m2  LMP 05/13/2013  Pt in NAD  Breast: no masses bilaterally; no skin changes; no drainage GYN: pt refused exam   Assessment:     Fibroids- unable to check size due to pts exam refusal Menorrhagia- controlled on OCP's     Plan:     Sprintec 1 po q day Info for free screening PAP Paperwork completed for free mammogram screen- stressed the importance of screening for breast CA

## 2013-07-01 ENCOUNTER — Other Ambulatory Visit: Payer: Self-pay | Admitting: Obstetrics & Gynecology

## 2013-07-01 DIAGNOSIS — Z1231 Encounter for screening mammogram for malignant neoplasm of breast: Secondary | ICD-10-CM

## 2013-07-03 ENCOUNTER — Ambulatory Visit (HOSPITAL_COMMUNITY)
Admission: RE | Admit: 2013-07-03 | Discharge: 2013-07-03 | Disposition: A | Discharge status: Home / Self Care | Payer: Self-pay | Source: Ambulatory Visit | Attending: Obstetrics & Gynecology | Admitting: Obstetrics & Gynecology

## 2013-07-03 DIAGNOSIS — Z1231 Encounter for screening mammogram for malignant neoplasm of breast: Secondary | ICD-10-CM

## 2013-08-06 ENCOUNTER — Observation Stay (HOSPITAL_COMMUNITY)
Admission: EM | Admit: 2013-08-06 | Discharge: 2013-08-08 | Disposition: A | Discharge status: Home / Self Care | Payer: Self-pay | Attending: Internal Medicine | Admitting: Internal Medicine

## 2013-08-06 ENCOUNTER — Emergency Department (HOSPITAL_COMMUNITY): Payer: Self-pay

## 2013-08-06 ENCOUNTER — Encounter (HOSPITAL_COMMUNITY): Payer: Self-pay | Admitting: Emergency Medicine

## 2013-08-06 DIAGNOSIS — F329 Major depressive disorder, single episode, unspecified: Secondary | ICD-10-CM | POA: Insufficient documentation

## 2013-08-06 DIAGNOSIS — Z7982 Long term (current) use of aspirin: Secondary | ICD-10-CM | POA: Insufficient documentation

## 2013-08-06 DIAGNOSIS — D500 Iron deficiency anemia secondary to blood loss (chronic): Secondary | ICD-10-CM

## 2013-08-06 DIAGNOSIS — I471 Supraventricular tachycardia: Secondary | ICD-10-CM

## 2013-08-06 DIAGNOSIS — F3289 Other specified depressive episodes: Secondary | ICD-10-CM | POA: Insufficient documentation

## 2013-08-06 DIAGNOSIS — Z6841 Body Mass Index (BMI) 40.0 and over, adult: Secondary | ICD-10-CM | POA: Insufficient documentation

## 2013-08-06 DIAGNOSIS — I498 Other specified cardiac arrhythmias: Principal | ICD-10-CM | POA: Insufficient documentation

## 2013-08-06 DIAGNOSIS — Z531 Procedure and treatment not carried out because of patient's decision for reasons of belief and group pressure: Secondary | ICD-10-CM | POA: Insufficient documentation

## 2013-08-06 DIAGNOSIS — N920 Excessive and frequent menstruation with regular cycle: Secondary | ICD-10-CM | POA: Insufficient documentation

## 2013-08-06 DIAGNOSIS — Z91199 Patient's noncompliance with other medical treatment and regimen due to unspecified reason: Secondary | ICD-10-CM | POA: Insufficient documentation

## 2013-08-06 DIAGNOSIS — D509 Iron deficiency anemia, unspecified: Secondary | ICD-10-CM | POA: Insufficient documentation

## 2013-08-06 DIAGNOSIS — D259 Leiomyoma of uterus, unspecified: Secondary | ICD-10-CM | POA: Insufficient documentation

## 2013-08-06 DIAGNOSIS — Z9119 Patient's noncompliance with other medical treatment and regimen: Secondary | ICD-10-CM | POA: Insufficient documentation

## 2013-08-06 HISTORY — DX: Excessive and frequent menstruation with regular cycle: N92.0

## 2013-08-06 HISTORY — DX: Leiomyoma of uterus, unspecified: D25.9

## 2013-08-06 MED ORDER — ADENOSINE 6 MG/2ML IV SOLN
INTRAVENOUS | Status: AC
  Filled 2013-08-06: qty 6

## 2013-08-06 MED ORDER — SODIUM CHLORIDE 0.9 % IV BOLUS (SEPSIS)
1000.0000 mL | Freq: Once | INTRAVENOUS | Status: AC
  Administered 2013-08-06: 1000 mL via INTRAVENOUS

## 2013-08-06 MED ORDER — ADENOSINE 6 MG/2ML IV SOLN
6.0000 mg | Freq: Once | INTRAVENOUS | Status: AC
  Administered 2013-08-06: 6 mg via INTRAVENOUS
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
## 2013-08-07 ENCOUNTER — Encounter (HOSPITAL_COMMUNITY): Payer: Self-pay | Admitting: Internal Medicine

## 2013-08-07 DIAGNOSIS — D509 Iron deficiency anemia, unspecified: Secondary | ICD-10-CM

## 2013-08-07 DIAGNOSIS — D500 Iron deficiency anemia secondary to blood loss (chronic): Secondary | ICD-10-CM

## 2013-08-07 DIAGNOSIS — D649 Anemia, unspecified: Secondary | ICD-10-CM | POA: Insufficient documentation

## 2013-08-07 DIAGNOSIS — I498 Other specified cardiac arrhythmias: Principal | ICD-10-CM

## 2013-08-07 DIAGNOSIS — I471 Supraventricular tachycardia: Secondary | ICD-10-CM | POA: Diagnosis present

## 2013-08-07 LAB — BASIC METABOLIC PANEL
ANION GAP: 19 — AB (ref 5–15)
BUN: 10 mg/dL (ref 6–23)
CHLORIDE: 101 meq/L (ref 96–112)
CO2: 18 meq/L — AB (ref 19–32)
Calcium: 9.4 mg/dL (ref 8.4–10.5)
Creatinine, Ser: 1.07 mg/dL (ref 0.50–1.10)
GFR calc Af Amer: 71 mL/min — ABNORMAL LOW (ref >90)
GFR calc non Af Amer: 61 mL/min — ABNORMAL LOW (ref >90)
Glucose, Bld: 112 mg/dL — ABNORMAL HIGH (ref 70–99)
POTASSIUM: 3.8 meq/L (ref 3.7–5.3)
Sodium: 138 mEq/L (ref 137–147)

## 2013-08-07 LAB — CBC
HEMATOCRIT: 22.5 % — AB (ref 36.0–46.0)
Hemoglobin: 6.1 g/dL — CL (ref 12.0–15.0)
MCH: 15.8 pg — ABNORMAL LOW (ref 26.0–34.0)
MCHC: 27.1 g/dL — ABNORMAL LOW (ref 30.0–36.0)
MCV: 58.4 fL — ABNORMAL LOW (ref 78.0–100.0)
Platelets: 543 10*3/uL — ABNORMAL HIGH (ref 150–400)
RBC: 3.85 MIL/uL — ABNORMAL LOW (ref 3.87–5.11)
RDW: 18.6 % — AB (ref 11.5–15.5)
WBC: 10 10*3/uL (ref 4.0–10.5)

## 2013-08-07 LAB — CBG MONITORING, ED: Glucose-Capillary: 112 mg/dL — ABNORMAL HIGH (ref 70–99)

## 2013-08-07 LAB — IRON AND TIBC
IRON: 10 ug/dL — AB (ref 42–135)
Saturation Ratios: 2 % — ABNORMAL LOW (ref 20–55)
TIBC: 570 ug/dL — ABNORMAL HIGH (ref 250–470)
UIBC: 560 ug/dL — ABNORMAL HIGH (ref 125–400)

## 2013-08-07 LAB — URINALYSIS, ROUTINE W REFLEX MICROSCOPIC
Bilirubin Urine: NEGATIVE
Glucose, UA: NEGATIVE mg/dL
Hgb urine dipstick: NEGATIVE
Ketones, ur: NEGATIVE mg/dL
Leukocytes, UA: NEGATIVE
Nitrite: NEGATIVE
PH: 5.5 (ref 5.0–8.0)
PROTEIN: NEGATIVE mg/dL
Specific Gravity, Urine: 1.013 (ref 1.005–1.030)
Urobilinogen, UA: 0.2 mg/dL (ref 0.0–1.0)

## 2013-08-07 LAB — CBC WITH DIFFERENTIAL/PLATELET
BASOS PCT: 1 % (ref 0–1)
Basophils Absolute: 0.1 10*3/uL (ref 0.0–0.1)
EOS ABS: 0.1 10*3/uL (ref 0.0–0.7)
EOS PCT: 1 % (ref 0–5)
HCT: 25.5 % — ABNORMAL LOW (ref 36.0–46.0)
HEMOGLOBIN: 6.9 g/dL — AB (ref 12.0–15.0)
Lymphocytes Relative: 25 % (ref 12–46)
Lymphs Abs: 2.1 10*3/uL (ref 0.7–4.0)
MCH: 16 pg — ABNORMAL LOW (ref 26.0–34.0)
MCHC: 27.1 g/dL — ABNORMAL LOW (ref 30.0–36.0)
MCV: 59.3 fL — ABNORMAL LOW (ref 78.0–100.0)
MONOS PCT: 9 % (ref 3–12)
Monocytes Absolute: 0.7 10*3/uL (ref 0.1–1.0)
NEUTROS PCT: 64 % (ref 43–77)
Neutro Abs: 5.3 10*3/uL (ref 1.7–7.7)
Platelets: 591 10*3/uL — ABNORMAL HIGH (ref 150–400)
RBC: 4.3 MIL/uL (ref 3.87–5.11)
RDW: 18.7 % — ABNORMAL HIGH (ref 11.5–15.5)
WBC: 8.3 10*3/uL (ref 4.0–10.5)

## 2013-08-07 LAB — FOLATE: Folate: 11.4 ng/mL

## 2013-08-07 LAB — VITAMIN B12: Vitamin B-12: 278 pg/mL (ref 211–911)

## 2013-08-07 LAB — MRSA PCR SCREENING: MRSA by PCR: NEGATIVE

## 2013-08-07 LAB — RETICULOCYTES
RBC.: 3.85 MIL/uL — ABNORMAL LOW (ref 3.87–5.11)
RETIC COUNT ABSOLUTE: 57.8 10*3/uL (ref 19.0–186.0)
RETIC CT PCT: 1.5 % (ref 0.4–3.1)

## 2013-08-07 LAB — FERRITIN: Ferritin: <1 ng/mL — ABNORMAL LOW (ref 10–291)

## 2013-08-07 LAB — TYPE AND SCREEN
ABO/RH(D): O POS
ANTIBODY SCREEN: NEGATIVE

## 2013-08-07 LAB — I-STAT TROPONIN, ED: Troponin i, poc: 0.04 ng/mL (ref 0.00–0.08)

## 2013-08-07 LAB — TROPONIN I: Troponin I: <0.3 ng/mL (ref <0.30)

## 2013-08-07 LAB — TSH: TSH: 1.16 u[IU]/mL (ref 0.350–4.500)

## 2013-08-07 MED ORDER — SODIUM CHLORIDE 0.9 % IV SOLN
INTRAVENOUS | Status: DC
  Administered 2013-08-07: 06:00:00 via INTRAVENOUS

## 2013-08-07 MED ORDER — SODIUM CHLORIDE 0.9 % IV SOLN
500.0000 mg | Freq: Once | INTRAVENOUS | Status: AC
  Administered 2013-08-07: 500 mg via INTRAVENOUS
  Filled 2013-08-07 (×2): qty 10

## 2013-08-07 MED ORDER — SODIUM CHLORIDE 0.9 % IV SOLN
500.0000 mg | Freq: Once | INTRAVENOUS | Status: AC
  Administered 2013-08-08: 500 mg via INTRAVENOUS
  Filled 2013-08-07 (×2): qty 10

## 2013-08-07 MED ORDER — HYDROMORPHONE HCL PF 1 MG/ML IJ SOLN: 0.5000 mg | INTRAMUSCULAR | Status: DC | PRN

## 2013-08-07 MED ORDER — FERROUS SULFATE 325 (65 FE) MG PO TABS
325.0000 mg | ORAL_TABLET | Freq: Once | ORAL | Status: AC
  Administered 2013-08-07: 325 mg via ORAL
  Filled 2013-08-07: qty 1

## 2013-08-07 MED ORDER — SODIUM CHLORIDE 0.9 % IV SOLN
25.0000 mg | Freq: Once | INTRAVENOUS | Status: AC
  Administered 2013-08-07: 25 mg via INTRAVENOUS
  Filled 2013-08-07 (×2): qty 0.5

## 2013-08-07 MED ORDER — OXYCODONE HCL 5 MG PO TABS: 5.0000 mg | ORAL_TABLET | ORAL | Status: DC | PRN

## 2013-08-07 MED ORDER — ONDANSETRON HCL 4 MG/2ML IJ SOLN: 4.0000 mg | Freq: Four times a day (QID) | INTRAMUSCULAR | Status: DC | PRN

## 2013-08-07 MED ORDER — ONDANSETRON HCL 4 MG PO TABS: 4.0000 mg | ORAL_TABLET | Freq: Four times a day (QID) | ORAL | Status: DC | PRN

## 2013-08-07 MED ORDER — ACETAMINOPHEN 650 MG RE SUPP: 650.0000 mg | Freq: Four times a day (QID) | RECTAL | Status: DC | PRN

## 2013-08-07 MED ORDER — ACETAMINOPHEN 325 MG PO TABS: 650.0000 mg | ORAL_TABLET | Freq: Four times a day (QID) | ORAL | Status: DC | PRN

## 2013-08-07 MED ORDER — NORGESTIMATE-ETH ESTRADIOL 0.25-35 MG-MCG PO TABS
1.0000 | ORAL_TABLET | Freq: Every day | ORAL | Status: DC
  Administered 2013-08-08: 1 via ORAL

## 2013-08-07 MED ORDER — ALUM & MAG HYDROXIDE-SIMETH 200-200-20 MG/5ML PO SUSP: 30.0000 mL | Freq: Four times a day (QID) | ORAL | Status: DC | PRN

## 2013-08-07 NOTE — ED Notes (Signed)
Attempted to call report

## 2013-08-07 NOTE — Progress Notes (Signed)
PATIENT DETAILS Name: [name] Age: 48 y.o. Sex: female Date of Birth: [birth year] Admit Date: 08/06/2013 Admitting Physician Theressa Millard, MD PCP:No PCP Per Patient  Subjective: No major complaints  Assessment/Plan: Principal Problem:   SVT (supraventricular tachycardia) -resolved with IV Adenosine -if BP tolerates-start Beta Blocket  Active Problems:   Microcytic anemia -has severe Fe Def Anemia-long standing hx of menorrhagia-known hx of Fibroids. No GI loss by history -refuses PRBC transfusion for religious reason-jehovah's witness. Claims even if blood transfusion needed for life threatening purposes-she would refuse -start IV Iron today, will need to discharge on oral Fe supplementation.She unfortunately, has been non compliant to oral Fe for almost one year.  Hx of Fibroid Uterus -has outpatient follow up with GYN  Morbid Obesity -counseled regarding weight loss  Disposition: Remain inpatient  DVT Prophylaxis: SCD's  Code Status: Full code   Family Communication Family member at bedside  Procedures:  None  CONSULTS:  None  Time spent 40 minutes-which includes 50% of the time with face-to-face with patient/ family and coordinating care related to the above assessment and plan.    MEDICATIONS: Scheduled Meds: . norgestimate-ethinyl estradiol  1 tablet Oral Daily   Continuous Infusions:  PRN Meds:.acetaminophen, acetaminophen, alum & mag hydroxide-simeth, HYDROmorphone (DILAUDID) injection, ondansetron (ZOFRAN) IV, ondansetron, oxyCODONE  Antibiotics: Anti-infectives   None       PHYSICAL EXAM: Vital signs in last 24 hours: Filed Vitals:   08/07/13 0557 08/07/13 0706 08/07/13 0707 08/07/13 1155  BP: 138/83  104/61   Pulse: 83     Temp: 98.1 F (36.7 C) 98.1 F (36.7 C)  98.3 F (36.8 C)  TempSrc: Oral Oral  Oral  Resp: 16     Height: 5\' 6"  (1.676 m)     Weight: 133.4 kg (294 lb 1.5 oz)     SpO2: 100%       Weight  change:  Filed Weights   08/07/13 0557  Weight: 133.4 kg (294 lb 1.5 oz)   Body mass index is 47.49 kg/(m^2).   Gen Exam: Awake and alert with clear speech.   Neck: Supple, No JVD.   Chest: B/L Clear.   CVS: S1 S2 Regular, no murmurs.  Abdomen: soft, BS +, non tender, non distended.  Extremities: no edema, lower extremities warm to touch. Neurologic: Non Focal.   Skin: No Rash.   Wounds: N/A.    Intake/Output from previous day:  Intake/Output Summary (Last 24 hours) at 08/07/13 1338 Last data filed at 08/07/13 0800  Gross per 24 hour  Intake   1200 ml  Output      0 ml  Net   1200 ml     LAB RESULTS: CBC  Recent Labs Lab 08/06/13 2355 08/07/13 0141  WBC 8.3 10.0  HGB 6.9* 6.1*  HCT 25.5* 22.5*  PLT 591* 543*  MCV 59.3* 58.4*  MCH 16.0* 15.8*  MCHC 27.1* 27.1*  RDW 18.7* 18.6*  LYMPHSABS 2.1  --   MONOABS 0.7  --   EOSABS 0.1  --   BASOSABS 0.1  --     Chemistries   Recent Labs Lab 08/06/13 2355  NA 138  K 3.8  CL 101  CO2 18*  GLUCOSE 112*  BUN 10  CREATININE 1.07  CALCIUM 9.4    CBG:  Recent Labs Lab 08/07/13 0023  GLUCAP 112*    GFR Estimated Creatinine Clearance: 92.2 ml/min (by C-G formula based on Cr of 1.07).  Coagulation profile No results found for this basename: INR, PROTIME,  in the last 168 hours  Cardiac Enzymes  Recent Labs Lab 08/07/13 1112  TROPONINI <0.30    No components found with this basename: POCBNP,  No results found for this basename: DDIMER,  in the last 72 hours No results found for this basename: HGBA1C,  in the last 72 hours No results found for this basename: CHOL, HDL, LDLCALC, TRIG, CHOLHDL, LDLDIRECT,  in the last 72 hours  Recent Labs  08/07/13 0141  TSH 1.160    Recent Labs  08/07/13 0141  VITAMINB12 278  FOLATE 11.4  FERRITIN <1*  TIBC 570*  IRON 10*  RETICCTPCT 1.5   No results found for this basename: LIPASE, AMYLASE,  in the last 72 hours  Urine Studies No results found  for this basename: UACOL, UAPR, USPG, UPH, UTP, UGL, UKET, UBIL, UHGB, UNIT, UROB, ULEU, UEPI, UWBC, URBC, UBAC, CAST, CRYS, UCOM, BILUA,  in the last 72 hours  MICROBIOLOGY: Recent Results (from the past 240 hour(s))  MRSA PCR SCREENING     Status: None   Collection Time    08/07/13  6:33 AM      Result Value Ref Range Status   MRSA by PCR NEGATIVE  NEGATIVE Final   Comment:            The GeneXpert MRSA Assay (FDA     approved for NASAL specimens     only), is one component of a     comprehensive MRSA colonization     surveillance program. It is not     intended to diagnose MRSA     infection nor to guide or     monitor treatment for     MRSA infections.    RADIOLOGY STUDIES/RESULTS: Dg Chest Port 1 View  08/07/2013   CLINICAL DATA:  Shortness of breath  EXAM: PORTABLE CHEST - 1 VIEW  COMPARISON:  02/26/2011  FINDINGS: The heart size and mediastinal contours are within normal limits. Both lungs are clear. The visualized skeletal structures are unremarkable.  IMPRESSION: No active disease.   Electronically Signed   By: Lucienne Capers M.D.   On: 08/07/2013 00:32    Oren Binet, MD  [facility] Pager:[phone]  If 7PM-7AM, please contact night-coverage [email or web address] Password [email or web address] 08/07/2013, 1:38 PM   LOS: 1 day   **Disclaimer: This note may have been dictated with voice recognition software. Similar sounding words can inadvertently be transcribed and this note may contain transcription errors which may not have been corrected upon publication of note.**

## 2013-08-07 NOTE — Progress Notes (Signed)
MEDICATION RELATED CONSULT NOTE - INITIAL   Pharmacy Consult for Iron Dextran Indication: Severe IDA   No Known Allergies  Patient Measurements: Height: 5\' 6"  (167.6 cm) Weight: 294 lb 1.5 oz (133.4 kg) IBW/kg (Calculated) : 59.3   Vital Signs: Temp: 98.3 F (36.8 C) (07/09 1155) Temp src: Oral (07/09 1155) BP: 104/61 mmHg (07/09 0707) Pulse Rate: 83 (07/09 0557) Intake/Output from previous day: 07/08 0701 - 07/09 0700 In: 1100 [I.V.:1100] Out: -  Intake/Output from this shift: Total I/O In: 100 [I.V.:100] Out: -   Labs:  Recent Labs  08/06/13 2355 08/07/13 0141  WBC 8.3 10.0  HGB 6.9* 6.1*  HCT 25.5* 22.5*  PLT 591* 543*  CREATININE 1.07  --    Estimated Creatinine Clearance: 92.2 ml/min (by C-G formula based on Cr of 1.07).   Microbiology: Recent Results (from the past 720 hour(s))  MRSA PCR SCREENING     Status: None   Collection Time    08/07/13  6:33 AM      Result Value Ref Range Status   MRSA by PCR NEGATIVE  NEGATIVE Final   Comment:            The GeneXpert MRSA Assay (FDA     approved for NASAL specimens     only), is one component of a     comprehensive MRSA colonization     surveillance program. It is not     intended to diagnose MRSA     infection nor to guide or     monitor treatment for     MRSA infections.    Medical History: Past Medical History  Diagnosis Date  . Anemia   . Refusal of blood transfusions as patient is Jehovah's Witness 12/29/10  . Dysrhythmia 12/29/10    "beating fast cause I don't have enough blood"  . Depression   . Postural dizziness 12/29/10    "cause my blood count is so low"  . Morbid obesity     BMI 47  . Menorrhagia   . Uterine fibroid     Medications:  Prescriptions prior to admission  Medication Sig Dispense Refill  . norgestimate-ethinyl estradiol (ORTHO-CYCLEN,SPRINTEC,PREVIFEM) 0.25-35 MG-MCG tablet Take 1 tablet by mouth daily.  1 Package  12    Assessment: 34 YOF with severe iron  deficiency anemia as a result of non-compliance with oral iron therapy. Hgb today down to 6.1. Iron stores are severely low. Pharmacy consulted to start IV iron therapy. Patient has received IV ferraheme in the past (2 years ago) with no issues. Will try IV iron dextran with test dose today   Goal of Therapy:  Resolution of IDA   Plan:  1) Give IV iron dextran 25 mg IV test dose, followed by 500 mg IV today and tomorrow for a total of 1 gm.  2) Monitor for allergic reactions including anaphylaxis.  3) Discharge on oral iron therapy 4) Consider re-checking Hgb levels in a few weeks to check for resolution of IDA  Albertina Parr, PharmD.  Clinical Pharmacist Pager [phone]
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
## 2013-08-07 NOTE — Progress Notes (Signed)
UR Completed Thersia Petraglia Graves-Bigelow, RN,BSN [phone]  

## 2013-08-08 LAB — CBC
HCT: 21.7 % — ABNORMAL LOW (ref 36.0–46.0)
HEMOGLOBIN: 5.7 g/dL — AB (ref 12.0–15.0)
MCH: 15.5 pg — ABNORMAL LOW (ref 26.0–34.0)
MCHC: 26.3 g/dL — ABNORMAL LOW (ref 30.0–36.0)
MCV: 59 fL — AB (ref 78.0–100.0)
Platelets: 446 10*3/uL — ABNORMAL HIGH (ref 150–400)
RBC: 3.68 MIL/uL — ABNORMAL LOW (ref 3.87–5.11)
RDW: 18.8 % — ABNORMAL HIGH (ref 11.5–15.5)
WBC: 7 10*3/uL (ref 4.0–10.5)

## 2013-08-08 MED ORDER — FERROUS SULFATE 325 (65 FE) MG PO TABS: 325.0000 mg | ORAL_TABLET | Freq: Three times a day (TID) | ORAL | Status: DC

## 2013-08-08 MED ORDER — PSYLLIUM 95 % PO PACK
1.0000 | PACK | Freq: Every day | ORAL | Status: DC
  Filled 2013-08-08: qty 1

## 2013-08-08 MED ORDER — FERROUS SULFATE 325 (65 FE) MG PO TABS
325.0000 mg | ORAL_TABLET | Freq: Three times a day (TID) | ORAL | Status: DC
  Filled 2013-08-08 (×3): qty 1

## 2013-08-08 MED ORDER — PSYLLIUM 95 % PO PACK: 1.0000 | PACK | Freq: Every day | ORAL | Status: DC

## 2013-08-08 NOTE — Care Management Note (Signed)
    Page 1 of 1   08/08/2013     3:12:58 PM CARE MANAGEMENT NOTE 08/08/2013  Patient:  [name],[name]   Account Number:  [id number]  Date Initiated:  08/08/2013  Documentation initiated by:  Marvetta Gibbons  Subjective/Objective Assessment:   Pt admitted with SVT and low hgb     Action/Plan:   PTA pt lived at home   Anticipated DC Date:  08/08/2013   Anticipated DC Plan:  [place]  CM consult  [facility]      Choice offered to / List presented to:             Status of service:  Completed, signed off Medicare Important Message given?   (If response is "NO", the following Medicare IM given date fields will be blank) Date Medicare IM given:   Medicare IM given by:   Date Additional Medicare IM given:   Additional Medicare IM given by:    Discharge Disposition:  HOME/SELF CARE  Per UR Regulation:  Reviewed for med. necessity/level of care/duration of stay  If discussed at [place] of Stay Meetings, dates discussed:    Comments:  08/08/13- 1430- Marvetta Gibbons RN, BSN [phone] Referral received for f/u appointment at [facility] wellness [facility] - call made this AM and message left that appointment was needed for pt discharging- awaiting return call from clinic- in to speak with pt and information given to pt regarding the [facility]- pt agreeable to f/u there- and phone # verified in epic- pt informed that she will be contacted with appointment when return call from clinic received. pt voiced understanding and agreed. Pt also has # to clinic and was advised to call clinic if she had not heard from anyone by Monday 08/11/12 and ask for f/u appointment. NCM to f/u with clinic regarding needed apointment and contact pt.
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
## 2013-08-08 NOTE — Discharge Instructions (Signed)
Anemia, Nonspecific  Anemia is a condition in which the concentration of red blood cells or hemoglobin in the blood is below normal. Hemoglobin is a substance in red blood cells that carries oxygen to the tissues of the body. Anemia results in not enough oxygen reaching these tissues.  CAUSES  Common causes of anemia include:   Excessive bleeding. Bleeding may be internal or external. This includes excessive bleeding from periods (in women) or from the intestine.   Poor nutrition.   Chronic kidney, thyroid, and liver disease.  Bone marrow disorders that decrease red blood cell production.  Cancer and treatments for cancer.  HIV, AIDS, and their treatments.  Spleen problems that increase red blood cell destruction.  Blood disorders.  Excess destruction of red blood cells due to infection, medicines, and autoimmune disorders. SIGNS AND SYMPTOMS   Minor weakness.   Dizziness.   Headache.  Palpitations.   Shortness of breath, especially with exercise.   Paleness.  Cold sensitivity.  Indigestion.  Nausea.  Difficulty sleeping.  Difficulty concentrating. Symptoms may occur suddenly or they may develop slowly.  DIAGNOSIS  Additional blood tests are often needed. These help your health care provider determine the best treatment. Your health care provider will check your stool for blood and look for other causes of blood loss.  TREATMENT  Treatment varies depending on the cause of the anemia. Treatment can include:   Supplements of iron, vitamin N82, or folic acid.   Hormone medicines.   A blood transfusion. This may be needed if blood loss is severe.   Hospitalization. This may be needed if there is significant continual blood loss.   Dietary changes.  Spleen removal. HOME CARE INSTRUCTIONS Keep all follow-up appointments. It often takes many weeks to correct anemia, and having your health care provider check on your condition and your response to  treatment is very important. SEEK IMMEDIATE MEDICAL CARE IF:   You develop extreme weakness, shortness of breath, or chest pain.   You become dizzy or have trouble concentrating.  You develop heavy vaginal bleeding.   You develop a rash.   You have bloody or black, tarry stools.   You faint.   You vomit up blood.   You vomit repeatedly.   You have abdominal pain.  You have a fever or persistent symptoms for more than 2-3 days.   You have a fever and your symptoms suddenly get worse.   You are dehydrated.  MAKE SURE YOU:  Understand these instructions.  Will watch your condition.  Will get help right away if you are not doing well or get worse. Document Released: 02/24/2004 Document Revised: 09/18/2012 Document Reviewed: 07/12/2012 [facility] Patient Information 2015 [place], [place]. This information is not intended to replace advice given to you by your health care provider. Make sure you discuss any questions you have with your health care provider.  Supraventricular Tachycardia  Supraventricular tachycardia (SVT) is an abnormal heart rhythm (arrhythmia) that causes the heart to beat very fast (tachycardia). This kind of fast heartbeat originates in the upper chambers of the heart (atria). SVT can cause the heart to beat greater than 100 beats per minute. SVT can have a rapid burst of heartbeats. This can start and stop suddenly without warning and is called nonsustained. SVT can also be sustained, in which the heart beats at a continuous fast rate.  CAUSES  There can be different causes of SVT. Some of these include:  Heart valve problems such as mitral valve prolapse.  An enlarged heart (hypertrophic cardiomyopathy).  Congenital heart problems.  Heart inflammation (pericarditis).  Hyperthyroidism.  Low potassium or magnesium levels.  Caffeine.  Drug use such as cocaine, methamphetamines, or stimulants.  Some over-the-counter medicines such  as:  Decongestants.  Diet medicines.  Herbal medicines. SYMPTOMS  Symptoms of SVT can vary. Symptoms depend on whether the SVT is sustained or nonsustained. You may experience:  No symptoms (asymptomatic).  An awareness of your heart beating rapidly (palpitations).  Shortness of breath.  Chest pain or pressure. If your blood pressure drops because of the SVT, you may experience:  Fainting or near fainting.  Weakness.  Dizziness. DIAGNOSIS  Different tests can be performed to diagnose SVT, such as:  An electrocardiogram (EKG). This is a painless test that records the electrical activity of your heart.  Holter monitor. This is a 24 hour recording of your heart rhythm. You will be given a diary. Write down all symptoms that you have and what you were doing at the time you experienced symptoms.  Arrhythmia monitor. This is a small device that your wear for several weeks. It records the heart rhythm when you have symptoms.  Echocardiogram. This is an imaging test to help detect abnormal heart structure such as congenital abnormalities, heart valve problems, or heart enlargement.  Stress test. This test can help determine if the SVT is related to exercise.  Electrophysiology study (EPS). This is a procedure that evaluates your heart's electrical system and can help your caregiver find the cause of your SVT. TREATMENT  Treatment of SVT depends on the symptoms, how often it recurs, and whether there are any underlying heart problems.   If symptoms are rare and no other cardiac disease is present, no treatment may be needed.  Blood work may be done to check potassium, magnesium, and thyroid hormone levels to see if they are abnormal. If these levels are abnormal, treatment to correct the problems will occur. Medicines Your caregiver may use oral medicines to treat SVT. These medicines are given for long-term control of SVT. Medicines may be used alone or in combination with other  treatments. These medicines work to slow nerve impulses in the heart muscle. These medicines can also be used to treat high blood pressure. Some of these medicines may include:  Calcium channel blockers.  Beta blockers.  Digoxin. Nonsurgical procedures Nonsurgical techniques may be used if oral medicines do not work. Some examples include:  Cardioversion. This technique uses either drugs or an electrical shock to restore a normal heart rhythm.  Cardioversion drugs may be given through an intravenous (IV) line to help "reset" the heart rhythm.  In electrical cardioversion, the caregiver shocks your heart to stop its beat for a split second. This helps to reset the heart to a normal rhythm.  Ablation. This procedure is done under mild sedation. High frequency radio wave energy is used to destroy the area of heart tissue responsible for the SVT. HOME CARE INSTRUCTIONS   Do not smoke.  Only take medicines prescribed by your caregiver. Check with your caregiver before using over-the-counter medicines.  Check with your caregiver about how much alcohol and caffeine (coffee, tea, colas, or chocolate) you may have.  It is very important to keep all follow-up referrals and appointments in order to properly manage this problem. SEEK IMMEDIATE MEDICAL CARE IF:  You have dizziness.  You faint or nearly faint.  You have shortness of breath.  You have chest pain or pressure.  You have sudden nausea  or vomiting.  You have profuse sweating.  You are concerned about how long your symptoms last.  You are concerned about the frequency of your SVT episodes. If you have the above symptoms, call your local emergency services (911 in [place] immediately. Do not drive yourself to the hospital. MAKE SURE YOU:   Understand these instructions.  Will watch your condition.  Will get help right away if you are not doing well or get worse. Document Released: 01/16/2005 Document Revised: 04/10/2011  Document Reviewed: 04/30/2008 [facility] Patient Information 2015 [place], [place]. This information is not intended to replace advice given to you by your health care provider. Make sure you discuss any questions you have with your health care provider.

## 2013-08-12 NOTE — Care Management Note (Signed)
Post discharge note- after multiple attempts to contact [facility] - was able to speak with someone today regarding f/u appointment- and appointment made for first available on July 22 10:45 with Dr. Verl Blalock. Pt called and given appointment time.

## 2013-08-20 ENCOUNTER — Ambulatory Visit: Payer: Self-pay | Attending: Cardiology | Admitting: Cardiology

## 2013-08-20 ENCOUNTER — Encounter: Payer: Self-pay | Admitting: Cardiology

## 2013-08-20 VITALS — BP 113/70 | HR 73 | Temp 98.5°F | Resp 20 | Ht 66.0 in | Wt 294.0 lb

## 2013-08-20 DIAGNOSIS — D259 Leiomyoma of uterus, unspecified: Secondary | ICD-10-CM | POA: Insufficient documentation

## 2013-08-20 DIAGNOSIS — Z9119 Patient's noncompliance with other medical treatment and regimen: Secondary | ICD-10-CM | POA: Insufficient documentation

## 2013-08-20 DIAGNOSIS — Z91199 Patient's noncompliance with other medical treatment and regimen due to unspecified reason: Secondary | ICD-10-CM | POA: Insufficient documentation

## 2013-08-20 DIAGNOSIS — I498 Other specified cardiac arrhythmias: Secondary | ICD-10-CM

## 2013-08-20 DIAGNOSIS — I471 Supraventricular tachycardia: Secondary | ICD-10-CM | POA: Insufficient documentation

## 2013-08-20 DIAGNOSIS — D500 Iron deficiency anemia secondary to blood loss (chronic): Secondary | ICD-10-CM | POA: Insufficient documentation

## 2013-08-20 DIAGNOSIS — Z87898 Personal history of other specified conditions: Secondary | ICD-10-CM | POA: Insufficient documentation

## 2013-08-20 NOTE — Progress Notes (Signed)
Patient recently hospitalized for SVT on 08/06/13-08/08/13. Patient indicates she has not had elevated heartrate since her admission, but did experience some chest pain and shortness of breath for 1-2 days after discharge. Indicates she has had coughing spells that she fears may trigger SVT. Patient is taking her ferrous sulfate as prescribed. Refuses any blood products.

## 2013-08-20 NOTE — Patient Instructions (Signed)
Continue taking iron supplement indefinitely. Please return to clinic in 4 weeks for CBC.  It was great to meet you! Take care!

## 2013-08-20 NOTE — Progress Notes (Signed)
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 This first occurred several years ago when her hemoglobin was in the fours. She has a history of uterine fibroids with dysfunctional bleeding. She has been noncompliant in the past on taking her iron.  She was recently admitted to the hospital for symptomatic SVT and severe iron deficiency anemia. I reviewed the records. She indeed had SVT which at one point was as fast as 174 beats a minute. One EKG reported 155 beats per minute. It broke with Adenocard. Her last EKG showed sinus tachycardia with normal intervals. One troponin was mildly positive from demand ischemia. Chest x-ray showed normal heart size. Echocardiogram was not done. Other blood work was unremarkable including a TSH. Toxicology screen was negative.  She does have dyspnea on exertion and fatigue. She does want to have a hysterectomy but this apparently has not been offered. I think compliance is been a significant problem in addition to the fact she is a Jehovah witness.  Past Medical History  Diagnosis Date  . Anemia   . Refusal of blood transfusions as patient is Jehovah's Witness 12/29/10  . Dysrhythmia 12/29/10    "beating fast cause I don't have enough blood"  . Depression   . Postural dizziness 12/29/10    "cause my blood count is so low"  . Morbid obesity     BMI 47  . Menorrhagia   . Uterine fibroid     Current Outpatient Prescriptions  Medication Sig Dispense Refill  . ferrous sulfate 325 (65 FE) MG tablet Take 1 tablet (325 mg total) by mouth 3 (three) times daily with meals.    3  . norgestimate-ethinyl estradiol (ORTHO-CYCLEN,SPRINTEC,PREVIFEM) 0.25-35 MG-MCG tablet Take 1 tablet by mouth daily.  1 Package  12  . psyllium (HYDROCIL/METAMUCIL) 95 % PACK Take 1 packet by mouth daily.  56 each     No current facility-administered medications for this visit.    No  Known Allergies  Family History  Problem Relation Age of Onset  . Breast cancer Maternal Grandmother 60  . Heart disease Mother     History   Social History  . Marital Status: Single    Spouse Name: N/A    Number of Children: N/A  . Years of Education: N/A   Occupational History  . Not on file.   Social History Main Topics  . Smoking status: Never Smoker   . Smokeless tobacco: Never Used  . Alcohol Use: No     Comment: "one mixed or cooler 2 X/month"  . Drug Use: No  . Sexual Activity: No   Other Topics Concern  . 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    ROS ALL NEGATIVE EXCEPT THOSE NOTED IN HPI  PE  General Appearance: well developed, well nourished in no acute distress, morbidly obese HEENT: symmetrical face, PERRLA, good dentition  Neck: no JVD, thyromegaly, or adenopathy, trachea midline Chest: symmetric without deformity Cardiac: PMI non-displaced, RRR, normal S1, S2, no gallop or murmur Lung: clear to ausculation and percussion Vascular: all pulses full without bruits  Abdominal: nondistended, nontender, good bowel sounds, no HSM, no bruits Extremities: no cyanosis, clubbing or edema, no sign of DVT, no varicosities  Skin: normal color, no rashes, pale nailbeds Neuro: alert and oriented x 3, non-focal Pysch: normal affect  EKG Not repeated. Her  rate today is 80 and regular. BMET    Component Value Date/Time   NA 138 08/06/2013 2355   K 3.8 08/06/2013 2355   CL 101 08/06/2013 2355   CO2 18* 08/06/2013 2355   GLUCOSE 112* 08/06/2013 2355   BUN 10 08/06/2013 2355   CREATININE 1.07 08/06/2013 2355   CALCIUM 9.4 08/06/2013 2355   GFRNONAA 61* 08/06/2013 2355   GFRAA 71* 08/06/2013 2355    Lipid Panel     Component Value Date/Time   CHOL 107 12/30/2010 0505   TRIG 50 12/30/2010 0505   HDL 55 12/30/2010 0505   CHOLHDL 1.9 12/30/2010 0505   VLDL 10 12/30/2010 0505   LDLCALC 42 12/30/2010 0505     CBC    Component Value Date/Time   WBC 7.0 08/08/2013 0346   RBC 3.68* 08/08/2013 0346   RBC 3.85* 08/07/2013 0141   HGB 5.7* 08/08/2013 0346   HCT 21.7* 08/08/2013 0346   PLT 446* 08/08/2013 0346   MCV 59.0* 08/08/2013 0346   MCH 15.5* 08/08/2013 0346   MCHC 26.3* 08/08/2013 0346   RDW 18.8* 08/08/2013 0346   LYMPHSABS 2.1 08/06/2013 2355   MONOABS 0.7 08/06/2013 2355   EOSABS 0.1 08/06/2013 2355   BASOSABS 0.1 08/06/2013 2355

## 2013-08-20 NOTE — Assessment & Plan Note (Signed)
Because of an adrenaline surge with a near miss accident, she went into SVT which was symptomatic for lightheadedness and palpitations. This is in the setting of a severe microcytic iron deficiency anemia secondary to uterine bleeding from fibroids. Her EKG when not in SVT is normal and her chest x-ray shows normal heart size. I suspect she does not have underlying organic heart disease.  Compliance has been a significant issue. I reviewed this several times in the office today. She will continue iron indefinitely. We will also get a CBC in 4 weeks to document an upper trend. Will establish her with primary care in our clinic. I have a call into Dr. Gala Romney of OB/GYN for future consultation regarding possible hysterectomy.  I've also made it clear to the patient that regardless of anemia she could have SVT again. If her blood counts are normal at that time, she will clearly be less symptomatic and less likely to have a syncopal event which could lead to an accident. She again understands the importance of getting a normal blood count and taking her iron indefinitely.

## 2013-09-01 ENCOUNTER — Ambulatory Visit: Payer: Self-pay | Admitting: Obstetrics & Gynecology

## 2013-09-16 ENCOUNTER — Ambulatory Visit: Payer: Self-pay

## 2013-09-17 ENCOUNTER — Other Ambulatory Visit: Payer: Self-pay

## 2013-10-03 ENCOUNTER — Encounter: Payer: Self-pay | Admitting: Obstetrics & Gynecology

## 2013-10-03 ENCOUNTER — Ambulatory Visit (INDEPENDENT_AMBULATORY_CARE_PROVIDER_SITE_OTHER): Payer: Self-pay | Admitting: Obstetrics & Gynecology

## 2013-10-03 VITALS — BP 126/76 | HR 87 | Temp 98.0°F | Wt 290.7 lb

## 2013-10-03 DIAGNOSIS — D500 Iron deficiency anemia secondary to blood loss (chronic): Secondary | ICD-10-CM

## 2013-10-03 DIAGNOSIS — D259 Leiomyoma of uterus, unspecified: Secondary | ICD-10-CM

## 2013-10-03 MED ORDER — MEGESTROL ACETATE 40 MG PO TABS: 40.0000 mg | ORAL_TABLET | Freq: Two times a day (BID) | ORAL | Status: DC

## 2013-10-03 NOTE — Patient Instructions (Signed)
Uterine Fibroid A uterine fibroid is a growth (tumor) that occurs in your uterus. This type of tumor is not cancerous and does not spread out of the uterus. You can have one or many fibroids. Fibroids can vary in size, weight, and where they grow in the uterus. Some can become quite large. Most fibroids do not require medical treatment, but some can cause pain or heavy bleeding during and between periods. CAUSES  A fibroid is the result of a single uterine cell that keeps growing (unregulated), which is different than most cells in the human body. Most cells have a control mechanism that keeps them from reproducing without control.  SIGNS AND SYMPTOMS   Bleeding.  Pelvic pain and pressure.  Bladder problems due to the size of the fibroid.  Infertility and miscarriages depending on the size and location of the fibroid. DIAGNOSIS  Uterine fibroids are diagnosed through a physical exam. Your health care provider may feel the lumpy tumors during a pelvic exam. Ultrasonography may be done to get information regarding size, location, and number of tumors.  TREATMENT   Your health care provider may recommend watchful waiting. This involves getting the fibroid checked by your health care provider to see if it grows or shrinks.   Hormone treatment or an intrauterine device (IUD) may be prescribed.   Surgery may be needed to remove the fibroids (myomectomy) or the uterus (hysterectomy). This depends on your situation. When fibroids interfere with fertility and a woman wants to become pregnant, a health care provider may recommend having the fibroids removed.  [place] care depends on how you were treated. In general:   Keep all follow-up appointments with your health care provider.   Only take over-the-counter or prescription medicines as directed by your health care provider. If you were prescribed a hormone treatment, take the hormone medicines exactly as directed. Do not  take aspirin. It can cause bleeding.   Talk to your health care provider about taking iron pills.  If your periods are troublesome but not so heavy, lie down with your feet raised slightly above your heart. Place cold packs on your lower abdomen.   If your periods are heavy, write down the number of pads or tampons you use per month. Bring this information to your health care provider.   Include green vegetables in your diet.  SEEK IMMEDIATE MEDICAL CARE IF:  You have pelvic pain or cramps not controlled with medicines.   You have a sudden increase in pelvic pain.   You have an increase in bleeding between and during periods.   You have excessive periods and soak tampons or pads in a half hour or less.  You feel lightheaded or have fainting episodes. Document Released: 01/14/2000 Document Revised: 11/06/2012 Document Reviewed: 08/15/2012 [facility] Patient Information 2015 [place], [place]. This information is not intended to replace advice given to you by your health care provider. Make sure you discuss any questions you have with your health care provider. Anemia, Nonspecific Anemia is a condition in which the concentration of red blood cells or hemoglobin in the blood is below normal. Hemoglobin is a substance in red blood cells that carries oxygen to the tissues of the body. Anemia results in not enough oxygen reaching these tissues.  CAUSES  Common causes of anemia include:   Excessive bleeding. Bleeding may be internal or external. This includes excessive bleeding from periods (in women) or from the intestine.   Poor nutrition.   Chronic kidney,  thyroid, and liver disease.  Bone marrow disorders that decrease red blood cell production.  Cancer and treatments for cancer.  HIV, AIDS, and their treatments.  Spleen problems that increase red blood cell destruction.  Blood disorders.  Excess destruction of red blood cells due to infection, medicines, and autoimmune  disorders. SIGNS AND SYMPTOMS   Minor weakness.   Dizziness.   Headache.  Palpitations.   Shortness of breath, especially with exercise.   Paleness.  Cold sensitivity.  Indigestion.  Nausea.  Difficulty sleeping.  Difficulty concentrating. Symptoms may occur suddenly or they may develop slowly.  DIAGNOSIS  Additional blood tests are often needed. These help your health care provider determine the best treatment. Your health care provider will check your stool for blood and look for other causes of blood loss.  TREATMENT  Treatment varies depending on the cause of the anemia. Treatment can include:   Supplements of iron, vitamin D98, or folic acid.   Hormone medicines.   A blood transfusion. This may be needed if blood loss is severe.   Hospitalization. This may be needed if there is significant continual blood loss.   Dietary changes.  Spleen removal. HOME CARE INSTRUCTIONS Keep all follow-up appointments. It often takes many weeks to correct anemia, and having your health care provider check on your condition and your response to treatment is very important. SEEK IMMEDIATE MEDICAL CARE IF:   You develop extreme weakness, shortness of breath, or chest pain.   You become dizzy or have trouble concentrating.  You develop heavy vaginal bleeding.   You develop a rash.   You have bloody or black, tarry stools.   You faint.   You vomit up blood.   You vomit repeatedly.   You have abdominal pain.  You have a fever or persistent symptoms for more than 2-3 days.   You have a fever and your symptoms suddenly get worse.   You are dehydrated.  MAKE SURE YOU:  Understand these instructions.  Will watch your condition.  Will get help right away if you are not doing well or get worse. Document Released: 02/24/2004 Document Revised: 09/18/2012 Document Reviewed: 07/12/2012 [facility] Patient Information 2015 [place], [place]. This information  is not intended to replace advice given to you by your health care provider. Make sure you discuss any questions you have with your health care provider.

## 2013-10-03 NOTE — Progress Notes (Signed)
Patient ID: [name], female   DOB: 10-30-1965, 48 y.o.   MRN: [id number] Pt presented to visit.  She reported that she did not know why she was here. When I went with Lovena Le the nurse to see what her issue was she reproted that she didn't know why she was here.  When asked about her bleeding she reported that it was 'ok on theOCP's' but, she had to go to the hosp recently after inquiring deeper-m she was counseled about a blood transfusion at the hosp but, she refured.  Her  CBC is  CBC    Component Value Date/Time   WBC 7.0 08/08/2013 0346   RBC 3.68* 08/08/2013 0346   RBC 3.85* 08/07/2013 0141   HGB 5.7* 08/08/2013 0346   HCT 21.7* 08/08/2013 0346   PLT 446* 08/08/2013 0346   MCV 59.0* 08/08/2013 0346   MCH 15.5* 08/08/2013 0346   MCHC 26.3* 08/08/2013 0346   RDW 18.8* 08/08/2013 0346   LYMPHSABS 2.1 08/06/2013 2355   MONOABS 0.7 08/06/2013 2355   EOSABS 0.1 08/06/2013 2355   BASOSABS 0.1 08/06/2013 2355    I stated to pt that per our previous conversation she needs a hyst.  She reports that she wants one but has still not completed her financial aid paprerwork.  She reports that she did get her free PAP and mammogram.  She reports she still refuses a blood transfusion.  Rec Megace 40mg  bid Pt encouraged to get her financial aid paperwork completed  Explained to pt that we need to get her Hgb up PRIOR to surgery.  Will attempt to use Megace to stop her menses completely. rec f/u in 2 months for CBC rec FeSO4 tid with meals  Bland Rudzinski L. Harraway-Smith, M.D., Cherlynn June

## 2013-10-03 NOTE — Progress Notes (Signed)
Patient here today and unsure of reason for visit. Dr. Ihor Dow reviewed patient labs-- noted patient HCT very low. Bleeding uncontrolled with Sprintec. Dr. Ihor Dow discussed with patient the need for hysterectomy. Megace 40mg  BID ordered to stop patient's bleeding. E-prescribed to [place]; called [facility] and RX $33-- too expensive for patient. [place] and RX $28.55-- RX called in and patient to pick up at [facility]. Financial Aid paperwork given to patient. Patient to fill out and return paperwork for surgery to be done-- explained to patient surgery will not be done until paperwork is filled out. Patient verbalized understanding. Dr. Ihor Dow advised patient to return in 2 months to check CBC-- patient's CBC must be 28 or higher to perform surgery. Patient instructed to take iron tablet 3 times daily-- one with every meal. Patient verbalized understanding and gratitude. No further questions or concerns. States she will return paperwork on Tuesday 10/07/13.

## 2013-11-18 ENCOUNTER — Telehealth: Payer: Self-pay | Admitting: *Deleted

## 2013-11-18 DIAGNOSIS — D500 Iron deficiency anemia secondary to blood loss (chronic): Secondary | ICD-10-CM

## 2013-11-18 MED ORDER — MEGESTROL ACETATE 40 MG PO TABS: 40.0000 mg | ORAL_TABLET | Freq: Two times a day (BID) | ORAL | Status: DC

## 2013-11-18 NOTE — Telephone Encounter (Signed)
When rx was called to pharmacy refills were not given. Note in epic states that patient could have 3 refills. Rx resent for patient. Patient informed.

## 2013-11-18 NOTE — Telephone Encounter (Signed)
Patient called and stated that she needs a refill on her megace.

## 2013-11-25 ENCOUNTER — Emergency Department (HOSPITAL_COMMUNITY)
Admission: EM | Admit: 2013-11-25 | Discharge: 2013-11-25 | Disposition: A | Discharge status: Home / Self Care | Payer: MEDICAID | Attending: Emergency Medicine | Admitting: Emergency Medicine

## 2013-11-25 ENCOUNTER — Encounter (HOSPITAL_COMMUNITY): Payer: Self-pay | Admitting: Emergency Medicine

## 2013-11-25 DIAGNOSIS — Z8679 Personal history of other diseases of the circulatory system: Secondary | ICD-10-CM | POA: Insufficient documentation

## 2013-11-25 DIAGNOSIS — Z8659 Personal history of other mental and behavioral disorders: Secondary | ICD-10-CM | POA: Insufficient documentation

## 2013-11-25 DIAGNOSIS — Z8742 Personal history of other diseases of the female genital tract: Secondary | ICD-10-CM | POA: Insufficient documentation

## 2013-11-25 DIAGNOSIS — Z79899 Other long term (current) drug therapy: Secondary | ICD-10-CM | POA: Insufficient documentation

## 2013-11-25 DIAGNOSIS — H9202 Otalgia, left ear: Secondary | ICD-10-CM | POA: Insufficient documentation

## 2013-11-25 DIAGNOSIS — D649 Anemia, unspecified: Secondary | ICD-10-CM | POA: Insufficient documentation

## 2013-11-25 DIAGNOSIS — H8111 Benign paroxysmal vertigo, right ear: Secondary | ICD-10-CM

## 2013-11-25 LAB — CBC
HCT: 39.8 % (ref 36.0–46.0)
Hemoglobin: 13.8 g/dL (ref 12.0–15.0)
MCH: 29.8 pg (ref 26.0–34.0)
MCHC: 34.7 g/dL (ref 30.0–36.0)
MCV: 86 fL (ref 78.0–100.0)
PLATELETS: 313 10*3/uL (ref 150–400)
RBC: 4.63 MIL/uL (ref 3.87–5.11)
RDW: 12.7 % (ref 11.5–15.5)
WBC: 7 10*3/uL (ref 4.0–10.5)

## 2013-11-25 LAB — BASIC METABOLIC PANEL
ANION GAP: 12 (ref 5–15)
BUN: 7 mg/dL (ref 6–23)
CALCIUM: 9.6 mg/dL (ref 8.4–10.5)
CO2: 22 meq/L (ref 19–32)
CREATININE: 0.91 mg/dL (ref 0.50–1.10)
Chloride: 106 mEq/L (ref 96–112)
GFR calc Af Amer: 86 mL/min — ABNORMAL LOW (ref >90)
GFR calc non Af Amer: 75 mL/min — ABNORMAL LOW (ref >90)
Glucose, Bld: 93 mg/dL (ref 70–99)
Potassium: 4.2 mEq/L (ref 3.7–5.3)
Sodium: 140 mEq/L (ref 137–147)

## 2013-11-25 MED ORDER — MECLIZINE HCL 50 MG PO TABS: 25.0000 mg | ORAL_TABLET | Freq: Three times a day (TID) | ORAL | Status: DC | PRN

## 2013-11-25 MED ORDER — MECLIZINE HCL 25 MG PO TABS
25.0000 mg | ORAL_TABLET | Freq: Once | ORAL | Status: AC
  Administered 2013-11-25: 25 mg via ORAL
  Filled 2013-11-25: qty 1

## 2013-11-25 NOTE — ED Notes (Signed)
PT reports dizziness x 2 days. Reports yesterday it was intermittent, today it is now constant. States "the room is spinning and I feel like if I moved too quickly I'll pass out." Denies syncope. Reports left sided ear ache x 1 week. Denies CP or SOB. Neuro intact. Grips equal, no arm drift. AO x 4. Ambulatory to triage.

## 2013-11-25 NOTE — ED Provider Notes (Signed)
CSN: [id number]     Arrival date & time 11/25/13  1412 History   First MD Initiated Contact with Patient 11/25/13 1912     Chief Complaint  Patient presents with  . Dizziness      HPI PT reports dizziness x 2 days. Reports yesterday it was intermittent, today it is now constant. States "the room is spinning and I feel like if I moved too quickly I'll pass out." Denies syncope. Reports left sided ear ache x 1 week. Denies CP or SOB. Neuro intact. Grips equal, no arm drift. AO x 4.   Past Medical History  Diagnosis Date  . Anemia   . Refusal of blood transfusions as patient is Jehovah's Witness 12/29/10  . Dysrhythmia 12/29/10    "beating fast cause I don't have enough blood"  . Depression   . Postural dizziness 12/29/10    "cause my blood count is so low"  . Morbid obesity     BMI 47  . Menorrhagia   . Uterine fibroid    Past Surgical History  Procedure Laterality Date  . No past surgeries    . Tooth extraction     Family History  Problem Relation Age of Onset  . Breast cancer Maternal Grandmother 60  . Heart disease Mother    History  Substance Use Topics  . Smoking status: Never Smoker   . Smokeless tobacco: Never Used  . Alcohol Use: No     Comment: "one mixed or cooler 2 X/month"   OB History   Grav Para Term Preterm Abortions TAB SAB Ect Mult Living   1 1 1       1      Review of Systems  All other systems reviewed and are negative  Allergies  Review of patient's allergies indicates no known allergies.  Home Medications   Prior to Admission medications   Medication Sig Start Date End Date Taking? Authorizing Provider  ferrous sulfate 325 (65 FE) MG tablet Take 1 tablet (325 mg total) by mouth 3 (three) times daily with meals. 08/08/13  Yes Cherene Altes, MD  megestrol (MEGACE) 40 MG tablet Take 1 tablet (40 mg total) by mouth 2 (two) times daily. 11/18/13  Yes Lavonia Drafts, MD  norgestimate-ethinyl estradiol  (ORTHO-CYCLEN,SPRINTEC,PREVIFEM) 0.25-35 MG-MCG tablet Take 1 tablet by mouth daily. 06/05/13  Yes Lavonia Drafts, MD  meclizine (ANTIVERT) 50 MG tablet Take 0.5 tablets (25 mg total) by mouth 3 (three) times daily as needed. 11/25/13   Dot Lanes, MD   BP 129/80  Pulse 79  Temp(Src) 98.2 F (36.8 C) (Oral)  Resp 18  SpO2 98%  LMP 11/18/2013 Physical Exam Physical Exam  Nursing note and vitals reviewed. Constitutional: She is oriented to person, place, and time. She appears well-developed and well-nourished. No distress.  HENT:  Ears: Both tympanic membranes appear normal with no signs of redness and no air-fluid levels. Mouth: Moist membranes without abnormalities. Head: Normocephalic and atraumatic.  Eyes: Pupils are equal, round, and reactive to light.  Neck: Normal range of motion.  no cervical adenopathy noted Cardiovascular: Normal rate and intact distal pulses.   Pulmonary/Chest: No respiratory distress.  Abdominal: Normal appearance. She exhibits no distension.  Musculoskeletal: Normal range of motion.  Neurological: She is alert and oriented to person, place, and time. No cranial nerve deficit.  when I lay the patient back turn her head to the right she becomes vertiginous with nystagmus which goes away approximately 15-30 seconds. Skin: Skin is warm  and dry. No rash noted.    ED Course  Procedures (including critical care time) Labs Review Labs Reviewed  BASIC METABOLIC PANEL - Abnormal; Notable for the following:    GFR calc non Af Amer 75 (*)    GFR calc Af Amer 86 (*)    All other components within normal limits  CBC       EKG Interpretation   Date/Time:  Tuesday November 25 2013 14:24:00 EDT Ventricular Rate:  78 PR Interval:  144 QRS Duration: 70 QT Interval:  374 QTC Calculation: 426 R Axis:   79 Text Interpretation:  Normal sinus rhythm Normal ECG Confirmed by Wister Hoefle   MD, Tahliyah Anagnos ([id number]) on 11/25/2013 7:11:47 PM     Discussed treatment  options including by mouth and the meclizine and gave her instructions about looking up exercises for BPV. MDM   Final diagnoses:  BPV (benign positional vertigo), right        Dot Lanes, MD 11/25/13 2010

## 2013-11-25 NOTE — ED Notes (Signed)
Pt reports "I don't want to do blood work. I want my ear checked first."

## 2013-11-25 NOTE — Discharge Instructions (Signed)
Brandt-Daroff Exercises    Benign Positional Vertigo Vertigo means you feel like you or your surroundings are moving when they are not. Benign positional vertigo is the most common form of vertigo. Benign means that the cause of your condition is not serious. Benign positional vertigo is more common in older adults. CAUSES  Benign positional vertigo is the result of an upset in the labyrinth system. This is an area in the middle ear that helps control your balance. This may be caused by a viral infection, head injury, or repetitive motion. However, often no specific cause is found. SYMPTOMS  Symptoms of benign positional vertigo occur when you move your head or eyes in different directions. Some of the symptoms may include:  Loss of balance and falls.  Vomiting.  Blurred vision.  Dizziness.  Nausea.  Involuntary eye movements (nystagmus). DIAGNOSIS  Benign positional vertigo is usually diagnosed by physical exam. If the specific cause of your benign positional vertigo is unknown, your caregiver may perform imaging tests, such as magnetic resonance imaging (MRI) or computed tomography (CT). TREATMENT  Your caregiver may recommend movements or procedures to correct the benign positional vertigo. Medicines such as meclizine, benzodiazepines, and medicines for nausea may be used to treat your symptoms. In rare cases, if your symptoms are caused by certain conditions that affect the inner ear, you may need surgery. HOME CARE INSTRUCTIONS   Follow your caregiver's instructions.  Move slowly. Do not make sudden body or head movements.  Avoid driving.  Avoid operating heavy machinery.  Avoid performing any tasks that would be dangerous to you or others during a vertigo episode.  Drink enough fluids to keep your urine clear or pale yellow. SEEK IMMEDIATE MEDICAL CARE IF:   You develop problems with walking, weakness, numbness, or using your arms, hands, or legs.  You have difficulty  speaking.  You develop severe headaches.  Your nausea or vomiting continues or gets worse.  You develop visual changes.  Your family or friends notice any behavioral changes.  Your condition gets worse.  You have a fever.  You develop a stiff neck or sensitivity to light. MAKE SURE YOU:   Understand these instructions.  Will watch your condition.  Will get help right away if you are not doing well or get worse. Document Released: 10/24/2005 Document Revised: 04/10/2011 Document Reviewed: 10/06/2010 [facility] Patient Information 2015 [place], [place]. This information is not intended to replace advice given to you by your health care provider. Make sure you discuss any questions you have with your health care provider.

## 2013-12-01 ENCOUNTER — Encounter (HOSPITAL_COMMUNITY): Payer: Self-pay | Admitting: Emergency Medicine

## 2014-04-01 ENCOUNTER — Emergency Department (HOSPITAL_COMMUNITY)
Admission: EM | Admit: 2014-04-01 | Discharge: 2014-04-01 | Disposition: A | Discharge status: Home / Self Care | Payer: Self-pay | Attending: Emergency Medicine | Admitting: Emergency Medicine

## 2014-04-01 ENCOUNTER — Encounter (HOSPITAL_COMMUNITY): Payer: Self-pay | Admitting: Emergency Medicine

## 2014-04-01 DIAGNOSIS — R079 Chest pain, unspecified: Secondary | ICD-10-CM

## 2014-04-01 DIAGNOSIS — Z8742 Personal history of other diseases of the female genital tract: Secondary | ICD-10-CM | POA: Insufficient documentation

## 2014-04-01 DIAGNOSIS — R002 Palpitations: Secondary | ICD-10-CM | POA: Insufficient documentation

## 2014-04-01 DIAGNOSIS — Z8659 Personal history of other mental and behavioral disorders: Secondary | ICD-10-CM | POA: Insufficient documentation

## 2014-04-01 DIAGNOSIS — D649 Anemia, unspecified: Secondary | ICD-10-CM | POA: Insufficient documentation

## 2014-04-01 DIAGNOSIS — Z8679 Personal history of other diseases of the circulatory system: Secondary | ICD-10-CM | POA: Insufficient documentation

## 2014-04-01 DIAGNOSIS — R0789 Other chest pain: Secondary | ICD-10-CM | POA: Insufficient documentation

## 2014-04-01 DIAGNOSIS — Z79899 Other long term (current) drug therapy: Secondary | ICD-10-CM | POA: Insufficient documentation

## 2014-04-01 LAB — I-STAT CHEM 8, ED
BUN: 7 mg/dL (ref 6–23)
CREATININE: 0.9 mg/dL (ref 0.50–1.10)
Calcium, Ion: 1.17 mmol/L (ref 1.12–1.23)
Chloride: 110 mmol/L (ref 96–112)
GLUCOSE: 91 mg/dL (ref 70–99)
HCT: 46 % (ref 36.0–46.0)
Hemoglobin: 15.6 g/dL — ABNORMAL HIGH (ref 12.0–15.0)
POTASSIUM: 3.9 mmol/L (ref 3.5–5.1)
SODIUM: 142 mmol/L (ref 135–145)
TCO2: 15 mmol/L (ref 0–100)

## 2014-04-01 LAB — CBC WITH DIFFERENTIAL/PLATELET
Basophils Absolute: 0 10*3/uL (ref 0.0–0.1)
Basophils Relative: 0 % (ref 0–1)
Eosinophils Absolute: 0.2 10*3/uL (ref 0.0–0.7)
Eosinophils Relative: 3 % (ref 0–5)
HCT: 41.9 % (ref 36.0–46.0)
Hemoglobin: 14.6 g/dL (ref 12.0–15.0)
LYMPHS PCT: 30 % (ref 12–46)
Lymphs Abs: 2.2 10*3/uL (ref 0.7–4.0)
MCH: 30.6 pg (ref 26.0–34.0)
MCHC: 34.8 g/dL (ref 30.0–36.0)
MCV: 87.8 fL (ref 78.0–100.0)
Monocytes Absolute: 0.6 10*3/uL (ref 0.1–1.0)
Monocytes Relative: 9 % (ref 3–12)
Neutro Abs: 4.4 10*3/uL (ref 1.7–7.7)
Neutrophils Relative %: 58 % (ref 43–77)
PLATELETS: 350 10*3/uL (ref 150–400)
RBC: 4.77 MIL/uL (ref 3.87–5.11)
RDW: 12.7 % (ref 11.5–15.5)
WBC: 7.4 10*3/uL (ref 4.0–10.5)

## 2014-04-01 LAB — I-STAT TROPONIN, ED: Troponin i, poc: 0.01 ng/mL (ref 0.00–0.08)

## 2014-04-01 NOTE — Discharge Instructions (Signed)
Follow-up with cardiology to discuss an event monitor or stress testing. The contact information has been provided on this discharge summary. Please call the office to arrange this appointment.  Return to the ER if your symptoms substantially worsen or change.   Palpitations A palpitation is the feeling that your heartbeat is irregular or is faster than normal. It may feel like your heart is fluttering or skipping a beat. Palpitations are usually not a serious problem. However, in some cases, you may need further medical evaluation. CAUSES  Palpitations can be caused by:  Smoking.  Caffeine or other stimulants, such as diet pills or energy drinks.  Alcohol.  Stress and anxiety.  Strenuous physical activity.  Fatigue.  Certain medicines.  Heart disease, especially if you have a history of irregular heart rhythms (arrhythmias), such as atrial fibrillation, atrial flutter, or supraventricular tachycardia.  An improperly working pacemaker or defibrillator. DIAGNOSIS  To find the cause of your palpitations, your health care provider will take your medical history and perform a physical exam. Your health care provider may also have you take a test called an ambulatory electrocardiogram (ECG). An ECG records your heartbeat patterns over a 24-hour period. You may also have other tests, such as:  Transthoracic echocardiogram (TTE). During echocardiography, sound waves are used to evaluate how blood flows through your heart.  Transesophageal echocardiogram (TEE).  Cardiac monitoring. This allows your health care provider to monitor your heart rate and rhythm in real time.  Holter monitor. This is a portable device that records your heartbeat and can help diagnose heart arrhythmias. It allows your health care provider to track your heart activity for several days, if needed.  Stress tests by exercise or by giving medicine that makes the heart beat faster. TREATMENT  Treatment of  palpitations depends on the cause of your symptoms and can vary greatly. Most cases of palpitations do not require any treatment other than time, relaxation, and monitoring your symptoms. Other causes, such as atrial fibrillation, atrial flutter, or supraventricular tachycardia, usually require further treatment. HOME CARE INSTRUCTIONS   Avoid:  Caffeinated coffee, tea, soft drinks, diet pills, and energy drinks.  Chocolate.  Alcohol.  Stop smoking if you smoke.  Reduce your stress and anxiety. Things that can help you relax include:  A method of controlling things in your body, such as your heartbeats, with your mind (biofeedback).  Yoga.  Meditation.  Physical activity such as swimming, jogging, or walking.  Get plenty of rest and sleep. SEEK MEDICAL CARE IF:   You continue to have a fast or irregular heartbeat beyond 24 hours.  Your palpitations occur more often. SEEK IMMEDIATE MEDICAL CARE IF:  You have chest pain or shortness of breath.  You have a severe headache.  You feel dizzy or you faint. MAKE SURE YOU:  Understand these instructions.  Will watch your condition.  Will get help right away if you are not doing well or get worse. Document Released: 01/14/2000 Document Revised: 01/21/2013 Document Reviewed: 03/17/2011 [facility] Patient Information 2015 [place], [place]. This information is not intended to replace advice given to you by your health care provider. Make sure you discuss any questions you have with your health care provider.  Chest Pain (Nonspecific) It is often hard to give a specific diagnosis for the cause of chest pain. There is always a chance that your pain could be related to something serious, such as a heart attack or a blood clot in the lungs. You need to follow up with  your health care provider for further evaluation. CAUSES   Heartburn.  Pneumonia or bronchitis.  Anxiety or stress.  Inflammation around your heart (pericarditis) or  lung (pleuritis or pleurisy).  A blood clot in the lung.  A collapsed lung (pneumothorax). It can develop suddenly on its own (spontaneous pneumothorax) or from trauma to the chest.  Shingles infection (herpes zoster virus). The chest wall is composed of bones, muscles, and cartilage. Any of these can be the source of the pain.  The bones can be bruised by injury.  The muscles or cartilage can be strained by coughing or overwork.  The cartilage can be affected by inflammation and become sore (costochondritis). DIAGNOSIS  Lab tests or other studies may be needed to find the cause of your pain. Your health care provider may have you take a test called an ambulatory electrocardiogram (ECG). An ECG records your heartbeat patterns over a 24-hour period. You may also have other tests, such as:  Transthoracic echocardiogram (TTE). During echocardiography, sound waves are used to evaluate how blood flows through your heart.  Transesophageal echocardiogram (TEE).  Cardiac monitoring. This allows your health care provider to monitor your heart rate and rhythm in real time.  Holter monitor. This is a portable device that records your heartbeat and can help diagnose heart arrhythmias. It allows your health care provider to track your heart activity for several days, if needed.  Stress tests by exercise or by giving medicine that makes the heart beat faster. TREATMENT   Treatment depends on what may be causing your chest pain. Treatment may include:  Acid blockers for heartburn.  Anti-inflammatory medicine.  Pain medicine for inflammatory conditions.  Antibiotics if an infection is present.  You may be advised to change lifestyle habits. This includes stopping smoking and avoiding alcohol, caffeine, and chocolate.  You may be advised to keep your head raised (elevated) when sleeping. This reduces the chance of acid going backward from your stomach into your esophagus. Most of the time,  nonspecific chest pain will improve within 2-3 days with rest and mild pain medicine.  HOME CARE INSTRUCTIONS   If antibiotics were prescribed, take them as directed. Finish them even if you start to feel better.  For the next few days, avoid physical activities that bring on chest pain. Continue physical activities as directed.  Do not use any tobacco products, including cigarettes, chewing tobacco, or electronic cigarettes.  Avoid drinking alcohol.  Only take medicine as directed by your health care provider.  Follow your health care provider's suggestions for further testing if your chest pain does not go away.  Keep any follow-up appointments you made. If you do not go to an appointment, you could develop lasting (chronic) problems with pain. If there is any problem keeping an appointment, call to reschedule. SEEK MEDICAL CARE IF:   Your chest pain does not go away, even after treatment.  You have a rash with blisters on your chest.  You have a fever. SEEK IMMEDIATE MEDICAL CARE IF:   You have increased chest pain or pain that spreads to your arm, neck, jaw, back, or abdomen.  You have shortness of breath.  You have an increasing cough, or you cough up blood.  You have severe back or abdominal pain.  You feel nauseous or vomit.  You have severe weakness.  You faint.  You have chills. This is an emergency. Do not wait to see if the pain will go away. Get medical help at once. Call  your local emergency services (911 in [place] Do not drive yourself to the hospital. MAKE SURE YOU:   Understand these instructions.  Will watch your condition.  Will get help right away if you are not doing well or get worse. Document Released: 10/26/2004 Document Revised: 01/21/2013 Document Reviewed: 08/22/2007 [facility] Patient Information 2015 [place], [place]. This information is not intended to replace advice given to you by your health care provider. Make sure you discuss any  questions you have with your health care provider.

## 2014-04-01 NOTE — ED Provider Notes (Signed)
CSN: [id number]     Arrival date & time 04/01/14  1639 History   First MD Initiated Contact with Patient 04/01/14 1745     Chief Complaint  Patient presents with  . Tachycardia  . Chest Pain     (Consider location/radiation/quality/duration/timing/severity/associated sxs/prior Treatment) HPI Comments: Patient is a 49 year old female with past medical history of anemia, SVT. She presents today for evaluation of intermittent chest discomfort for the past 2 weeks. She is also having episodes of racing heart beat. These episodes are not associated with exertion, nausea, diaphoresis, difficulty breathing, or radiation to the arm or jaw.  She was admitted to the hospital last year after being diagnosed with anemia and SVT. She followed up with Dr. wall in the cardiology clinic, however no further treatment was recommended.  Patient is a 49 y.o. female presenting with chest pain. The history is provided by the patient.  Chest Pain Pain location:  Substernal area Pain quality: tightness   Pain radiates to:  Does not radiate Pain radiates to the back: no   Pain severity:  Moderate Onset quality:  Sudden Duration:  2 weeks Timing:  Intermittent Progression:  Worsening Chronicity:  Recurrent Relieved by:  Nothing Worsened by:  Nothing tried Associated symptoms: palpitations     Past Medical History  Diagnosis Date  . Anemia   . Refusal of blood transfusions as patient is Jehovah's Witness 12/29/10  . Dysrhythmia 12/29/10    "beating fast cause I don't have enough blood"  . Depression   . Postural dizziness 12/29/10    "cause my blood count is so low"  . Morbid obesity     BMI 47  . Menorrhagia   . Uterine fibroid    Past Surgical History  Procedure Laterality Date  . No past surgeries    . Tooth extraction     Family History  Problem Relation Age of Onset  . Breast cancer Maternal Grandmother 60  . Heart disease Mother    History  Substance Use Topics  . Smoking status:  Never Smoker   . Smokeless tobacco: Never Used  . Alcohol Use: No     Comment: "one mixed or cooler 2 X/month"   OB History    Gravida Para Term Preterm AB TAB SAB Ectopic Multiple Living   1 1 1       1      Review of Systems  Cardiovascular: Positive for chest pain and palpitations.  All other systems reviewed and are negative.     Allergies  Review of patient's allergies indicates no known allergies.  Home Medications   Prior to Admission medications   Medication Sig Start Date End Date Taking? Authorizing Provider  ferrous sulfate 325 (65 FE) MG tablet Take 1 tablet (325 mg total) by mouth 3 (three) times daily with meals. 08/08/13   Cherene Altes, MD  meclizine (ANTIVERT) 50 MG tablet Take 0.5 tablets (25 mg total) by mouth 3 (three) times daily as needed. 11/25/13   Dot Lanes, MD  megestrol (MEGACE) 40 MG tablet Take 1 tablet (40 mg total) by mouth 2 (two) times daily. 11/18/13   Lavonia Drafts, MD  norgestimate-ethinyl estradiol (ORTHO-CYCLEN,SPRINTEC,PREVIFEM) 0.25-35 MG-MCG tablet Take 1 tablet by mouth daily. 06/05/13   Lavonia Drafts, MD   BP 151/94 mmHg  Pulse 85  Temp(Src) 97.8 F (36.6 C)  Resp 18  Ht 5\' 6"  (1.676 m)  Wt 300 lb (136.079 kg)  BMI 48.44 kg/m2  SpO2 100% Physical Exam  Constitutional: She is oriented to person, place, and time. She appears well-developed and well-nourished. No distress.  HENT:  Head: Normocephalic and atraumatic.  Neck: Normal range of motion. Neck supple.  Cardiovascular: Normal rate and regular rhythm.  Exam reveals no gallop and no friction rub.   No murmur heard. Pulmonary/Chest: Effort normal and breath sounds normal. No respiratory distress. She has no wheezes.  Abdominal: Soft. Bowel sounds are normal. She exhibits no distension. There is no tenderness.  Musculoskeletal: Normal range of motion.  Neurological: She is alert and oriented to person, place, and time.  Skin: Skin is warm and dry. She  is not diaphoretic.  Nursing note and vitals reviewed.   ED Course  Procedures (including critical care time) Labs Review Labs Reviewed  I-STAT CHEM 8, ED - Abnormal; Notable for the following:    Hemoglobin 15.6 (*)    All other components within normal limits  CBC WITH DIFFERENTIAL/PLATELET  I-STAT TROPOININ, ED    Imaging Review No results found.   EKG Interpretation   Date/Time:  Wednesday April 01 2014 16:44:38 EST Ventricular Rate:  103 PR Interval:  160 QRS Duration: 70 QT Interval:  334 QTC Calculation: 437 R Axis:   89 Text Interpretation:  Sinus tachycardia T wave abnormality, consider  inferior ischemia Abnormal ECG Confirmed by DELOS  MD, Frida Wahlstrom ([id number]) on  04/01/2014 5:49:57 PM      MDM   Final diagnoses:  None    Patient presents with palpitations and chest pressure. She has a history of SVT and I suspect she may be experiencing episodes of this again. I doubt cardiac ischemia as there is no exertional component. I will recommend she follow-up with cardiology to discuss an event monitor and possible stress testing. She understands to return if her symptoms worsen or change.    Veryl Speak, MD 04/01/14 [phone]

## 2014-04-01 NOTE — ED Notes (Signed)
Pt st's she has had pain in mid chest off and on x's 1 week.  St's today heart has been racing.  Also st's when heart rate was high she had shortness of breath

## 2014-04-27 ENCOUNTER — Telehealth: Payer: Self-pay | Admitting: General Practice

## 2014-04-27 DIAGNOSIS — D500 Iron deficiency anemia secondary to blood loss (chronic): Secondary | ICD-10-CM

## 2014-04-27 MED ORDER — MEGESTROL ACETATE 40 MG PO TABS: 40.0000 mg | ORAL_TABLET | Freq: Two times a day (BID) | ORAL | Status: DC

## 2014-04-27 NOTE — Telephone Encounter (Signed)
Patient called and left message stating she needs a refill on her megestrol, she is completely out. Spoke to Dr Ihor Dow who authorized month refill and that patient needs to come back in and be seen and also inquire if the patient has insurance now. Called patient back and informed her of refill and need for follow up. Patient verbalized understanding. Asked patient if she was approved for [facility] Assistance and she states no. Told patient the front office will call her with an appt for likely the end of April. Patient verbalized understanding and had no other questions

## 2014-05-22 ENCOUNTER — Ambulatory Visit: Payer: Self-pay | Admitting: Obstetrics & Gynecology

## 2014-05-26 ENCOUNTER — Emergency Department (HOSPITAL_COMMUNITY)
Admission: EM | Admit: 2014-05-26 | Discharge: 2014-05-26 | Disposition: A | Discharge status: Home / Self Care | Payer: No Typology Code available for payment source | Attending: Emergency Medicine | Admitting: Emergency Medicine

## 2014-05-26 ENCOUNTER — Encounter (HOSPITAL_COMMUNITY): Payer: Self-pay | Admitting: *Deleted

## 2014-05-26 DIAGNOSIS — Z8679 Personal history of other diseases of the circulatory system: Secondary | ICD-10-CM | POA: Diagnosis not present

## 2014-05-26 DIAGNOSIS — Z8659 Personal history of other mental and behavioral disorders: Secondary | ICD-10-CM | POA: Diagnosis not present

## 2014-05-26 DIAGNOSIS — Z79899 Other long term (current) drug therapy: Secondary | ICD-10-CM | POA: Insufficient documentation

## 2014-05-26 DIAGNOSIS — D649 Anemia, unspecified: Secondary | ICD-10-CM | POA: Insufficient documentation

## 2014-05-26 DIAGNOSIS — Y9389 Activity, other specified: Secondary | ICD-10-CM | POA: Insufficient documentation

## 2014-05-26 DIAGNOSIS — Z86018 Personal history of other benign neoplasm: Secondary | ICD-10-CM | POA: Insufficient documentation

## 2014-05-26 DIAGNOSIS — S6991XA Unspecified injury of right wrist, hand and finger(s), initial encounter: Secondary | ICD-10-CM | POA: Insufficient documentation

## 2014-05-26 DIAGNOSIS — S3992XA Unspecified injury of lower back, initial encounter: Secondary | ICD-10-CM | POA: Diagnosis not present

## 2014-05-26 DIAGNOSIS — Y92410 Unspecified street and highway as the place of occurrence of the external cause: Secondary | ICD-10-CM | POA: Insufficient documentation

## 2014-05-26 DIAGNOSIS — Y998 Other external cause status: Secondary | ICD-10-CM | POA: Insufficient documentation

## 2014-05-26 DIAGNOSIS — Z8742 Personal history of other diseases of the female genital tract: Secondary | ICD-10-CM | POA: Diagnosis not present

## 2014-05-26 MED ORDER — IBUPROFEN 800 MG PO TABS: 800.0000 mg | ORAL_TABLET | Freq: Three times a day (TID) | ORAL | Status: DC

## 2014-05-26 MED ORDER — METHOCARBAMOL 500 MG PO TABS: 500.0000 mg | ORAL_TABLET | Freq: Two times a day (BID) | ORAL | Status: DC

## 2014-05-26 MED ORDER — IBUPROFEN 400 MG PO TABS
800.0000 mg | ORAL_TABLET | Freq: Once | ORAL | Status: AC
  Administered 2014-05-26: 800 mg via ORAL
  Filled 2014-05-26: qty 2
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
## 2014-05-26 NOTE — Discharge Instructions (Signed)
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
## 2014-06-12 ENCOUNTER — Ambulatory Visit (INDEPENDENT_AMBULATORY_CARE_PROVIDER_SITE_OTHER): Payer: Self-pay | Admitting: Obstetrics & Gynecology

## 2014-06-12 ENCOUNTER — Encounter: Payer: Self-pay | Admitting: Obstetrics & Gynecology

## 2014-06-12 VITALS — BP 124/61 | HR 79 | Wt 309.9 lb

## 2014-06-12 DIAGNOSIS — D500 Iron deficiency anemia secondary to blood loss (chronic): Secondary | ICD-10-CM

## 2014-06-12 MED ORDER — MEGESTROL ACETATE 40 MG PO TABS: 40.0000 mg | ORAL_TABLET | Freq: Two times a day (BID) | ORAL | Status: DC

## 2014-06-12 NOTE — Progress Notes (Signed)
Subjective:     Patient ID: [name], female   DOB: [birth year], 49 y.o.   MRN: [id number]  HPI Pt presents for f/u of AUB.  She reports that she is no longer passing clots and feels like her bleeding is improved.  She reports that she has spacing of her menses for 6 weeks and feel like she may be in perimenopause.  She has been adjusting her Megace dosage because she is worried about the blood 'building up' if she is not having menses.  She denies weight loss or other constitutional sx.  She reports that she is on her menses today and therefore declines a pelvic exam.    Review of Systems     Objective:   Physical Exam BP 124/61 mmHg  Pulse 79  Wt 309 lb 14.4 oz (140.57 kg) Pt in NAD Exam deferred     Assessment:     AUB- pt with sx uterine fibroids. She also needs to have her endometrium re sampled.  It is difficult to assess the amount of bleeding an pt has adjusted her Megace.  I have reeducated her about Megace and bleeding and the need to r/o endometrial cancer     Plan:     Needs PAP, Endometrial biopsy and pelvic exam- pt declined exam due to being on her menses.  Pt counseled to make her next appt when she is NOT on her menses. I have stressed the importance of the need for eval. Pt wants surgery and she was told that the eval needed to be completed.  She was also instructed to complete the financial aid paperwork and consider a consultation with financial aid at the hosp Pelvic sono to check size of fibroids and endometrial stripe. She needs an endometrial bx and a PAP.  She reports that she will get her exam next visit. 15 min were spent with the pt in face to face discussion      Continue Megace 40mg  daily.

## 2014-06-12 NOTE — Progress Notes (Signed)
[place] scheduled for May 20th @ 2pm.

## 2014-06-19 ENCOUNTER — Ambulatory Visit (HOSPITAL_COMMUNITY): Admission: RE | Admit: 2014-06-19 | Payer: MEDICAID | Source: Ambulatory Visit

## 2014-07-10 ENCOUNTER — Ambulatory Visit: Payer: Self-pay | Admitting: Obstetrics & Gynecology

## 2014-07-31 ENCOUNTER — Ambulatory Visit (INDEPENDENT_AMBULATORY_CARE_PROVIDER_SITE_OTHER): Payer: Self-pay | Admitting: Obstetrics & Gynecology

## 2014-07-31 ENCOUNTER — Other Ambulatory Visit (HOSPITAL_COMMUNITY)
Admission: RE | Admit: 2014-07-31 | Discharge: 2014-07-31 | Disposition: A | Discharge status: Home / Self Care | Payer: MEDICAID | Source: Ambulatory Visit | Attending: Obstetrics & Gynecology | Admitting: Obstetrics & Gynecology

## 2014-07-31 ENCOUNTER — Encounter: Payer: Self-pay | Admitting: Obstetrics & Gynecology

## 2014-07-31 VITALS — BP 126/90 | HR 94 | Temp 97.9°F | Ht 66.0 in | Wt 319.6 lb

## 2014-07-31 DIAGNOSIS — Z124 Encounter for screening for malignant neoplasm of cervix: Secondary | ICD-10-CM

## 2014-07-31 DIAGNOSIS — Z3202 Encounter for pregnancy test, result negative: Secondary | ICD-10-CM

## 2014-07-31 DIAGNOSIS — N939 Abnormal uterine and vaginal bleeding, unspecified: Secondary | ICD-10-CM

## 2014-07-31 DIAGNOSIS — D259 Leiomyoma of uterus, unspecified: Secondary | ICD-10-CM

## 2014-07-31 DIAGNOSIS — J069 Acute upper respiratory infection, unspecified: Secondary | ICD-10-CM

## 2014-07-31 DIAGNOSIS — R87619 Unspecified abnormal cytological findings in specimens from cervix uteri: Secondary | ICD-10-CM | POA: Insufficient documentation

## 2014-07-31 DIAGNOSIS — Z01812 Encounter for preprocedural laboratory examination: Secondary | ICD-10-CM

## 2014-07-31 DIAGNOSIS — Z1151 Encounter for screening for human papillomavirus (HPV): Secondary | ICD-10-CM

## 2014-07-31 DIAGNOSIS — D500 Iron deficiency anemia secondary to blood loss (chronic): Secondary | ICD-10-CM

## 2014-07-31 LAB — POCT PREGNANCY, URINE: Preg Test, Ur: NEGATIVE

## 2014-07-31 MED ORDER — MEGESTROL ACETATE 40 MG PO TABS: 40.0000 mg | ORAL_TABLET | Freq: Two times a day (BID) | ORAL | Status: DC

## 2014-07-31 MED ORDER — AZITHROMYCIN 250 MG PO TABS: 250.0000 mg | ORAL_TABLET | Freq: Every day | ORAL | Status: DC

## 2014-07-31 NOTE — Patient Instructions (Signed)
Dysfunctional Uterine Bleeding Normally, menstrual periods begin between ages 11 to 17 in young women. A normal menstrual cycle/period may begin every 23 days up to 35 days and lasts from 1 to 7 days. Around 12 to 14 days before your menstrual period starts, ovulation (ovary produces an egg) occurs. When counting the time between menstrual periods, count from the first day of bleeding of the previous period to the first day of bleeding of the next period. Dysfunctional (abnormal) uterine bleeding is bleeding that is different from a normal menstrual period. Your periods may come earlier or later than usual. They may be lighter, have blood clots or be heavier. You may have bleeding between periods, or you may skip one period or more. You may have bleeding after sexual intercourse, bleeding after menopause, or no menstrual period. CAUSES   Pregnancy (normal, miscarriage, tubal).  IUDs (intrauterine device, birth control).  Birth control pills.  Hormone treatment.  Menopause.  Infection of the cervix.  Blood clotting problems.  Infection of the inside lining of the uterus.  Endometriosis, inside lining of the uterus growing in the pelvis and other female organs.  Adhesions (scar tissue) inside the uterus.  Obesity or severe weight loss.  Uterine polyps inside the uterus.  Cancer of the vagina, cervix, or uterus.  Ovarian cysts or polycystic ovary syndrome.  Medical problems (diabetes, thyroid disease).  Uterine fibroids (noncancerous tumor).  Problems with your female hormones.  Endometrial hyperplasia, very thick lining and enlarged cells inside of the uterus.  Medicines that interfere with ovulation.  Radiation to the pelvis or abdomen.  Chemotherapy. DIAGNOSIS   Your doctor will discuss the history of your menstrual periods, medicines you are taking, changes in your weight, stress in your life, and any medical problems you may have.  Your doctor will do a physical  and pelvic examination.  Your doctor may want to perform certain tests to make a diagnosis, such as:  Pap test.  Blood tests.  Cultures for infection.  CT scan.  Ultrasound.  Hysteroscopy.  Laparoscopy.  MRI.  Hysterosalpingography.  D and C.  Endometrial biopsy. TREATMENT  Treatment will depend on the cause of the dysfunctional uterine bleeding (DUB). Treatment may include:  Observing your menstrual periods for a couple of months.  Prescribing medicines for medical problems, including:  Antibiotics.  Hormones.  Birth control pills.  Removing an IUD (intrauterine device, birth control).  Surgery:  D and C (scrape and remove tissue from inside the uterus).  Laparoscopy (examine inside the abdomen with a lighted tube).  Uterine ablation (destroy lining of the uterus with electrical current, laser, heat, or freezing).  Hysteroscopy (examine cervix and uterus with a lighted tube).  Hysterectomy (remove the uterus). HOME CARE INSTRUCTIONS   If medicines were prescribed, take exactly as directed. Do not change or switch medicines without consulting your caregiver.  Long term heavy bleeding may result in iron deficiency. Your caregiver may have prescribed iron pills. They help replace the iron that your body lost from heavy bleeding. Take exactly as directed.  Do not take aspirin or medicines that contain aspirin one week before or during your menstrual period. Aspirin may make the bleeding worse.  If you need to change your sanitary pad or tampon more than once every 2 hours, stay in bed with your feet elevated and a cold pack on your lower abdomen. Rest as much as possible, until the bleeding stops or slows down.  Eat well-balanced meals. Eat foods high in iron. Examples   are:  Leafy green vegetables.  Whole-grain breads and cereals.  Eggs.  Meat.  Liver.  Do not try to lose weight until the abnormal bleeding has stopped and your blood iron level is  back to normal. Do not lift more than ten pounds or do strenuous activities when you are bleeding.  For a couple of months, make note on your calendar, marking the start and ending of your period, and the type of bleeding (light, medium, heavy, spotting, clots or missed periods). This is for your caregiver to better evaluate your problem. SEEK MEDICAL CARE IF:   You develop nausea (feeling sick to your stomach) and vomiting, dizziness, or diarrhea while you are taking your medicine.  You are getting lightheaded or weak.  You have any problems that may be related to the medicine you are taking.  You develop pain with your DUB.  You want to remove your IUD.  You want to stop or change your birth control pills or hormones.  You have any type of abnormal bleeding mentioned above.  You are over 58 years old and have not had a menstrual period yet.  You are 49 years old and you are still having menstrual periods.  You have any of the symptoms mentioned above.  You develop a rash. SEEK IMMEDIATE MEDICAL CARE IF:   An oral temperature above 102 F (38.9 C) develops.  You develop chills.  You are changing your sanitary pad or tampon more than once an hour.  You develop abdominal pain.  You pass out or faint. Document Released: 01/14/2000 Document Revised: 04/10/2011 Document Reviewed: 12/15/2008 [facility] Patient Information 2015 [place], [place]. This information is not intended to replace advice given to you by your health care provider. Make sure you discuss any questions you have with your health care provider. Upper Respiratory Infection, Adult An upper respiratory infection (URI) is also sometimes known as the common cold. The upper respiratory tract includes the nose, sinuses, throat, trachea, and bronchi. Bronchi are the airways leading to the lungs. Most people improve within 1 week, but symptoms can last up to 2 weeks. A residual cough may last even longer.  CAUSES Many  different viruses can infect the tissues lining the upper respiratory tract. The tissues become irritated and inflamed and often become very moist. Mucus production is also common. A cold is contagious. You can easily spread the virus to others by oral contact. This includes kissing, sharing a glass, coughing, or sneezing. Touching your mouth or nose and then touching a surface, which is then touched by another person, can also spread the virus. SYMPTOMS  Symptoms typically develop 1 to 3 days after you come in contact with a cold virus. Symptoms vary from person to person. They may include:  Runny nose.  Sneezing.  Nasal congestion.  Sinus irritation.  Sore throat.  Loss of voice (laryngitis).  Cough.  Fatigue.  Muscle aches.  Loss of appetite.  Headache.  Low-grade fever. DIAGNOSIS  You might diagnose your own cold based on familiar symptoms, since most people get a cold 2 to 3 times a year. Your caregiver can confirm this based on your exam. Most importantly, your caregiver can check that your symptoms are not due to another disease such as strep throat, sinusitis, pneumonia, asthma, or epiglottitis. Blood tests, throat tests, and X-rays are not necessary to diagnose a common cold, but they may sometimes be helpful in excluding other more serious diseases. Your caregiver will decide if any further tests are  required. RISKS AND COMPLICATIONS  You may be at risk for a more severe case of the common cold if you smoke cigarettes, have chronic heart disease (such as heart failure) or lung disease (such as asthma), or if you have a weakened immune system. The very young and very old are also at risk for more serious infections. Bacterial sinusitis, middle ear infections, and bacterial pneumonia can complicate the common cold. The common cold can worsen asthma and chronic obstructive pulmonary disease (COPD). Sometimes, these complications can require emergency medical care and may be  life-threatening. PREVENTION  The best way to protect against getting a cold is to practice good hygiene. Avoid oral or hand contact with people with cold symptoms. Wash your hands often if contact occurs. There is no clear evidence that vitamin C, vitamin E, echinacea, or exercise reduces the chance of developing a cold. However, it is always recommended to get plenty of rest and practice good nutrition. TREATMENT  Treatment is directed at relieving symptoms. There is no cure. Antibiotics are not effective, because the infection is caused by a virus, not by bacteria. Treatment may include:  Increased fluid intake. Sports drinks offer valuable electrolytes, sugars, and fluids.  Breathing heated mist or steam (vaporizer or shower).  Eating chicken soup or other clear broths, and maintaining good nutrition.  Getting plenty of rest.  Using gargles or lozenges for comfort.  Controlling fevers with ibuprofen or acetaminophen as directed by your caregiver.  Increasing usage of your inhaler if you have asthma. Zinc gel and zinc lozenges, taken in the first 24 hours of the common cold, can shorten the duration and lessen the severity of symptoms. Pain medicines may help with fever, muscle aches, and throat pain. A variety of non-prescription medicines are available to treat congestion and runny nose. Your caregiver can make recommendations and may suggest nasal or lung inhalers for other symptoms.  HOME CARE INSTRUCTIONS   Only take over-the-counter or prescription medicines for pain, discomfort, or fever as directed by your caregiver.  Use a warm mist humidifier or inhale steam from a shower to increase air moisture. This may keep secretions moist and make it easier to breathe.  Drink enough water and fluids to keep your urine clear or pale yellow.  Rest as needed.  Return to work when your temperature has returned to normal or as your caregiver advises. You may need to stay home longer to  avoid infecting others. You can also use a face mask and careful hand washing to prevent spread of the virus. SEEK MEDICAL CARE IF:   After the first few days, you feel you are getting worse rather than better.  You need your caregiver's advice about medicines to control symptoms.  You develop chills, worsening shortness of breath, or brown or red sputum. These may be signs of pneumonia.  You develop yellow or brown nasal discharge or pain in the face, especially when you bend forward. These may be signs of sinusitis.  You develop a fever, swollen neck glands, pain with swallowing, or white areas in the back of your throat. These may be signs of strep throat. SEEK IMMEDIATE MEDICAL CARE IF:   You have a fever.  You develop severe or persistent headache, ear pain, sinus pain, or chest pain.  You develop wheezing, a prolonged cough, cough up blood, or have a change in your usual mucus (if you have chronic lung disease).  You develop sore muscles or a stiff neck. Document Released: 07/12/2000 Document  Revised: 04/10/2011 Document Reviewed: 04/23/2013 [facility] Patient Information 2015 [place], [place]. This information is not intended to replace advice given to you by your health care provider. Make sure you discuss any questions you have with your health care provider.

## 2014-07-31 NOTE — Progress Notes (Signed)
Patient ID: [name], female   DOB: 20-Jul-1965, 49 y.o.   MRN: [id number] History:  49 y.o. G1P1001 here today for eval of fibroids.  Pt reports that she is not bleeding on Megace. She denies pain but, reports that she feels a 'caged' like feeling in her abd.    Pt also reports that she has had a cough for 6 weeks.  She reports that she does not have a primary care physician.  She denies fever or chills.   The cough is nonproductive.  Pt is not a smoker.  The following portions of the patient's history were reviewed and updated as appropriate: allergies, current medications, past family history, past medical history, past social history, past surgical history and problem list.  Review of Systems:  Pertinent items are noted in HPI.  Objective:  Physical Exam Blood pressure 126/90, pulse 94, temperature 97.9 F (36.6 C), temperature source Oral, height 5\' 6"  (1.676 m), weight 319 lb 9.6 oz (144.97 kg). Gen: NAD Abd: Soft, obese, nontender and nondistended Gyn: Pelvic: Normal appearing external genitalia; normal appearing vaginal mucosa and cervix.  Normal discharge.  Small uterus, no other palpable masses, no uterine or adnexal tenderness. PAP obtained   The indications for endometrial biopsy were reviewed.   Risks of the biopsy including cramping, bleeding, infection, uterine perforation, inadequate specimen and need for additional procedures  were discussed. The patient states she understands and agrees to undergo procedure today. Consent was signed. Time out was performed. Urine HCG was negative. A sterile speculum was placed in the patient's vagina and the cervix was prepped with Betadine. A single-toothed tenaculum was placed on the anterior lip of the cervix to stabilize it. The 3 mm pipelle was introduced into the endometrial cavity without difficulty to a depth of 9cm, and a moderate amount of tissue was obtained and sent to pathology. The instruments were removed from the patient's  vagina. Minimal bleeding from the cervix was noted. The patient tolerated the procedure well. Routine post-procedure instructions were given to the patient. The patient will follow up to review the results and for further management.      Labs and Imaging 06/13/2012 Clinical Data: Menorrhagia with fibroids. LMP questionably more than 6 months prior to this exam  TRANSABDOMINAL AND TRANSVAGINAL ULTRASOUND OF PELVIS Technique: Both transabdominal and transvaginal ultrasound examinations of the pelvis were performed. Transabdominal technique was performed for global imaging of the pelvis including uterus, ovaries, adnexal regions, and pelvic cul-de-sac.  It was necessary to proceed with endovaginal exam following the transabdominal exam to visualize the the cul-de-sac.  Comparison: 12/29/2010  Findings:  Uterus: The uterus is enlarged with a sagittal length of 15.3 cm, depth of 11 cm and width of 15 cm. Evaluation is best performed transabdominally due to the uterine size. Several fibroids are identified with the largest located in the left lateral fundal region measuring 11.5 x 10.2 x 13.5 cm. This fibroid displaces the endometrial lining towards the right and may have a partial submucosal component. Smaller fibroids are identified in the right lateral fundal region, mural measuring 3.1 x 2.1 x 3.1 cm and in the anterior lower uterine segment and best seen endovaginally measuring 2.9 x 2.2 x 3.5 cm  Endometrium: Can only be seen transabdominally and the entire endometrium is not visualized due to the presence of the fibroid load limiting evaluation.  Right ovary: Is only seen transabdominally and has a normal transabdominal appearance measuring 3.3 x 2.6 x 2.3 cm  Left ovary: Has a  normal appearance measuring 2.4 x 1.5 x 1.5 cm  Other findings: No pelvic fluid is seen.  IMPRESSION: Fibroid uterus with measurable fibroid sizes and locations as noted above.  If more complete assessment of the fibroid load and relationship with the endometrium is desired, pelvic MRI would be recommended.  Normal ovaries.   Original Report Authenticated By: Ponciano Ort, M.D.           Vitals     Height Weight BMI (Calculated)    5\' 6"  (1.676 m) 287 lb 12.8 oz (130.545 kg) 46.5      Interpretation Summary     *RADIOLOGY REPORT*  Clinical Data: Menorrhagia with fibroids. LMP questionably more than 6 months prior to this exam  TRANSABDOMINAL AND TRANSVAGINAL ULTRASOUND OF PELVIS Technique: Both transabdominal and transvaginal ultrasound examinations of the pelvis were performed. Transabdominal technique was performed for global imaging of the pelvis including uterus, ovaries, adnexal regions, and pelvic cul-de-sac.  It was necessary to proceed with endovaginal exam following the transabdominal exam to visualize the the cul-de-sac.  Comparison: 12/29/2010  Findings:  Uterus: The uterus is enlarged with a sagittal length of 15.3 cm, depth of 11 cm and width of 15 cm. Evaluation is best performed transabdominally due to the uterine size. Several fibroids are identified with the largest located in the left lateral fundal region measuring 11.5 x 10.2 x 13.5 cm. This fibroid displaces the endometrial lining towards the right and may have a partial submucosal component. Smaller fibroids are identified in the right lateral fundal region, mural measuring 3.1 x 2.1 x 3.1 cm and in the anterior lower uterine segment and best seen endovaginally measuring 2.9 x 2.2 x 3.5 cm  Endometrium: Can only be seen transabdominally and the entire endometrium is not visualized due to the presence of the fibroid load limiting evaluation.  Right ovary: Is only seen transabdominally and has a normal transabdominal appearance measuring 3.3 x 2.6 x 2.3 cm  Left ovary: Has a normal appearance measuring 2.4 x 1.5 x 1.5 cm  Other  findings: No pelvic fluid is seen.  IMPRESSION: Fibroid uterus with measurable fibroid sizes and locations as noted above. If more complete assessment of the fibroid load and relationship with the endometrium is desired, pelvic MRI would be recommended.  Normal ovaries.       Assessment & Plan:  Pt with uterine fibroids.  Sx controlled with Megace.  Pt now with 'vague' sx that could be pressure sx.  Will obtain sono to assess growth of fibroids.  Pt reports that she is unable to get financial assistance due to outstanding bill.  Pt wants fibroids out even though she is asymptomatic at present.     PAP and endo bx done today   URI- will treat with course of ATBX as sx have been prolonged   F/u PAP F/u Endobx Keep Megace 40mg  daily Pelvic sono F/u in 1 year or sooner  Z-pack for URI

## 2014-08-05 LAB — CYTOLOGY - PAP

## 2014-08-07 ENCOUNTER — Ambulatory Visit (HOSPITAL_COMMUNITY)
Admission: RE | Admit: 2014-08-07 | Discharge: 2014-08-07 | Disposition: A | Discharge status: Home / Self Care | Payer: Self-pay | Source: Ambulatory Visit | Attending: Obstetrics & Gynecology | Admitting: Obstetrics & Gynecology

## 2014-08-07 DIAGNOSIS — R102 Pelvic and perineal pain: Secondary | ICD-10-CM | POA: Insufficient documentation

## 2014-08-07 DIAGNOSIS — N939 Abnormal uterine and vaginal bleeding, unspecified: Secondary | ICD-10-CM | POA: Insufficient documentation

## 2014-08-07 DIAGNOSIS — D259 Leiomyoma of uterus, unspecified: Secondary | ICD-10-CM

## 2015-06-08 MED FILL — MEGESTROL 40 MG TABLET: 40 | 30 days supply | Qty: 60 | Fill #4

## 2015-09-14 ENCOUNTER — Ambulatory Visit (INDEPENDENT_AMBULATORY_CARE_PROVIDER_SITE_OTHER): Payer: Self-pay | Admitting: Internal Medicine

## 2015-09-14 ENCOUNTER — Encounter: Payer: Self-pay | Admitting: Internal Medicine

## 2015-09-14 VITALS — BP 173/92 | HR 81 | Temp 98.6°F | Ht 66.0 in | Wt 348.0 lb

## 2015-09-14 DIAGNOSIS — D509 Iron deficiency anemia, unspecified: Secondary | ICD-10-CM

## 2015-09-14 DIAGNOSIS — I471 Supraventricular tachycardia: Secondary | ICD-10-CM

## 2015-09-14 DIAGNOSIS — R000 Tachycardia, unspecified: Secondary | ICD-10-CM

## 2015-09-14 DIAGNOSIS — K5900 Constipation, unspecified: Secondary | ICD-10-CM

## 2015-09-14 DIAGNOSIS — E669 Obesity, unspecified: Secondary | ICD-10-CM

## 2015-09-14 LAB — POCT GLYCOSYLATED HEMOGLOBIN (HGB A1C): HEMOGLOBIN A1C: 5.2

## 2015-09-14 LAB — POCT HEMOGLOBIN: HEMOGLOBIN: 13.8 g/dL (ref 12.2–16.2)

## 2015-09-14 NOTE — Patient Instructions (Signed)
It was nice meeting you today Ms. Brevik.   I have placed a referral for an echocardiogram (ultrasound) of your heart. You will be called with the date and location.   In the meantime, if your heart rate is elevated, try deep breathing techniques, or relaxation techniques like sitting or laying down. If you develop severe chest pain or shortness of breath, please go to the emergency room.   We will let you know the results of your ultrasound and see you back in the office after it has been completed.   If you have any questions or concerns, please feel free to call the clinic.   Be well,  Dr. Avon Gully

## 2015-09-14 NOTE — Assessment & Plan Note (Signed)
Cleared by cardiology two years ago, however symptoms concerning to patient. Hgb WNL at 13 today, so tachycardia less likely 2/2 anemia. As patient never had echo performed in prior work-up, will place order for echo. If no structural abnormalities to explain symptoms, will offer patient low dose beta blocker.

## 2015-09-14 NOTE — Assessment & Plan Note (Signed)
Present intermittently for three weeks. Relieved by OTC laxative. Likely 2/2 recent diet change.  - Begin OTC fiber supplements - Can continue to use OTC laxative as necessary

## 2015-09-14 NOTE — Progress Notes (Signed)
Subjective:    Patient ID: [name], female    DOB: 19-Sep-1965, 50 y.o.   MRN: [id number]  HPI  Patient presents for new patient appointment to discuss tachycardia and constipation.   Tachycardia Patient with history of SVT. Was hospitalized for symptomatic SVT in 2015, likely 2/2 microcytic anemia, with hemoglobin in the 4s at that time. Patient was seen by cardiology outpatient after that hospitalization, and was cleared from a cardiac standpoint. No medication was started and patient was not instructed to f/u. Patient had multiple EKGs performed throughout admission and at cardiology f/u, however echo was not performed. Patient was taking iron supplements at that time, and was counseled by her cardiologist that it was important to continue taking supplements. She stopped taking iron supplements a year ago because she thought she did not need them anymore. She has never been prescribed any other medications for heart conditions.  Today patient complaining of palpitations/tachycardia for a few hours at a time occurring about twice a month. She reports difficulty breathing during the episodes, with occasional accompanying dizziness and changes in vision. Patient reports occasional chest pressure either before or after the episode, but never during. Describes the chest pressure as "a finger touching my heart." Symptoms often improve if patient practices deep breathing exercises, or sits down/lays down. 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   Constipation Reporting constipation for the past three weeks. No prior history of constipation. Went four days without bowel movement. Has been taking OTC laxatives (bisacodyl) intermittently. Recently changed to healthier diet, but is unsure of fiber content. Started walking up flights of stairs for exercise and is concerned this may be the cause.    Review of Systems See HPI.     Objective:   Physical Exam  Constitutional: She is oriented to person, place, and time.  Obese female in NAD  HENT:  Head: Normocephalic and atraumatic.  Right Ear: External ear normal.  Left Ear: External ear normal.  Nose: Nose normal.  Mouth/Throat: Oropharynx is clear and moist. No oropharyngeal exudate.  Eyes: Conjunctivae and EOM are normal. Pupils are equal, round, and reactive to light. Right eye exhibits no discharge. Left eye exhibits no discharge.  Cardiovascular: Normal rate, regular rhythm and normal heart sounds.  Exam reveals no friction rub.   No murmur heard. Pulmonary/Chest: Effort normal and breath sounds normal. No respiratory distress. She has no wheezes.  Abdominal: Soft. Bowel sounds are normal. She exhibits no distension. There is no tenderness.  Rotund  Musculoskeletal: She exhibits edema (1+ pitting edema in L foot and leg to mid shin).  No TTP of chest wall  Neurological: She is alert and oriented to person, place, and time.  Skin:  Large hyperpigmented lesion on R lateral lower leg  Psychiatric: She has a normal mood and affect. Her behavior is normal.      Assessment & Plan:  SVT (supraventricular tachycardia) Cleared by cardiology two years ago, however symptoms concerning to patient. Hgb WNL at 13 today, so tachycardia less likely 2/2 anemia. As patient never had echo performed in prior work-up, will place order for echo. If no structural abnormalities to explain symptoms, will offer patient low dose beta blocker.   Constipation Present intermittently for three weeks. Relieved by OTC laxative. Likely 2/2 recent diet change.  - Begin OTC fiber supplements - Can continue to use  OTC laxative as necessary   Adin Hector, MD, MPH PGY-2 [facility] Pager [phone]

## 2015-09-15 LAB — BASIC METABOLIC PANEL WITH GFR
BUN: 6 mg/dL — ABNORMAL LOW (ref 7–25)
CALCIUM: 9.1 mg/dL (ref 8.6–10.2)
CO2: 19 mmol/L — ABNORMAL LOW (ref 20–31)
CREATININE: 0.77 mg/dL (ref 0.50–1.10)
Chloride: 112 mmol/L — ABNORMAL HIGH (ref 98–110)
GFR, Est African American: >89 mL/min (ref >=60)
GFR, Est Non African American: >89 mL/min (ref >=60)
Glucose, Bld: 84 mg/dL (ref 65–99)
Potassium: 3.7 mmol/L (ref 3.5–5.3)
SODIUM: 142 mmol/L (ref 135–146)

## 2015-09-27 ENCOUNTER — Ambulatory Visit: Payer: Self-pay | Admitting: Internal Medicine

## 2016-07-03 ENCOUNTER — Telehealth: Payer: Self-pay | Admitting: *Deleted

## 2016-07-03 NOTE — Telephone Encounter (Signed)
[name] called front desk and asked to speak with a nurse . She states she had not had a period for over a year and then started having periods a few months ago and they are getting heavier and more painful. Was on megace before for heavy bleeding and states that is the only thing that helped.  Per chart seen in office last 05/2014 by Dr. Maylene RoesTamala Julian. I informed [name] I will message the doctor and see if she can refill the medicine or if she must be seen in the office first and then call her back.  Also to go to mau if she is having heavy bleeding enough to saturate pad in one hour or less x 2 or more hours. She voices understanding

## 2016-07-04 NOTE — Telephone Encounter (Signed)
I called [name] back and notified her Dr. Ihor Dow wants to evaluate her in our office and then will decide if refilling the medicine is appropriate or if anything else should be done. I explained registrar will call her asap with appt. She voices understanding.

## 2016-07-04 NOTE — Telephone Encounter (Signed)
[name],  This should have been an automatic visit for this pt.  It would never be acceptable to prescribe this pt a medication after 2 years of not being seen and certainly not after 1 year of not having menses.  clh-S

## 2016-07-13 ENCOUNTER — Ambulatory Visit: Payer: Self-pay | Admitting: Obstetrics & Gynecology

## 2016-07-17 ENCOUNTER — Encounter: Payer: Self-pay | Admitting: Obstetrics & Gynecology

## 2016-07-17 ENCOUNTER — Ambulatory Visit (INDEPENDENT_AMBULATORY_CARE_PROVIDER_SITE_OTHER): Payer: Self-pay | Admitting: Obstetrics & Gynecology

## 2016-07-17 ENCOUNTER — Other Ambulatory Visit (HOSPITAL_COMMUNITY)
Admission: RE | Admit: 2016-07-17 | Discharge: 2016-07-17 | Disposition: A | Discharge status: Home / Self Care | Payer: Self-pay | Source: Ambulatory Visit | Attending: Obstetrics & Gynecology | Admitting: Obstetrics & Gynecology

## 2016-07-17 VITALS — BP 137/87 | HR 82 | Wt 359.3 lb

## 2016-07-17 DIAGNOSIS — Z01419 Encounter for gynecological examination (general) (routine) without abnormal findings: Secondary | ICD-10-CM | POA: Insufficient documentation

## 2016-07-17 DIAGNOSIS — Z124 Encounter for screening for malignant neoplasm of cervix: Secondary | ICD-10-CM

## 2016-07-17 DIAGNOSIS — Z3202 Encounter for pregnancy test, result negative: Secondary | ICD-10-CM

## 2016-07-17 DIAGNOSIS — D259 Leiomyoma of uterus, unspecified: Secondary | ICD-10-CM

## 2016-07-17 DIAGNOSIS — N938 Other specified abnormal uterine and vaginal bleeding: Secondary | ICD-10-CM

## 2016-07-17 DIAGNOSIS — Z1151 Encounter for screening for human papillomavirus (HPV): Secondary | ICD-10-CM

## 2016-07-17 LAB — POCT PREGNANCY, URINE: Preg Test, Ur: NEGATIVE

## 2016-07-17 MED ORDER — MEGESTROL ACETATE 40 MG PO TABS: 40.0000 mg | ORAL_TABLET | Freq: Two times a day (BID) | ORAL | 5 refills | Status: DC

## 2016-07-17 NOTE — Progress Notes (Signed)
Pt declines scheduling [place] per Dr. Hulan Fray recommendation.  Pt states "why should I have to do all this over again and then nothing ever happens."  "No one will do surgery one me because I do not have insurance."  I strongly encouraged pt to fill out the financial assistance paperwork.  Pt stated "why should I fill that out when I make to much for the program but not enough to make a living, I live poorly."  "I just want the Megace please."  Notified Dr. Hulan Fray of pt refusal.  Pt sent for blood work.

## 2016-07-17 NOTE — Progress Notes (Signed)
   Subjective:    Patient ID: [name], female    DOB: 10-19-1965, 51 y.o.   MRN: [id number]  HPI 51 yo AA P1 here with continued DUB versus PMB. She did not bleed for about a year and then started with the heavy bleeding again. She was requesting a refill of megace but has not been seen here since 2016. She had an EMBX at that time that was negative. She has known fibroids.   Review of Systems     Objective:   Physical Exam Morbidly obese BFNAD Breathing, conversing, and ambulating normally  UPT negative, consent signed, time out done Cervix prepped with betadine and grasped with a single tooth tenaculum Uterus sounded to 14 cm Pipelle used for 2 passes with a large amount of tissue obtained. She tolerated the procedure well.     Assessment & Plan:  Follow up fibroids- schedule gyn u/s DUB at 51 yo- check TSH, FSH, and CBC Encourage weight loss, discussed increased risk of uterine cancer Megace prn

## 2016-07-18 LAB — CBC
HEMOGLOBIN: 13.9 g/dL (ref 11.1–15.9)
Hematocrit: 40.6 % (ref 34.0–46.6)
MCH: 29.7 pg (ref 26.6–33.0)
MCHC: 34.2 g/dL (ref 31.5–35.7)
MCV: 87 fL (ref 79–97)
Platelets: 329 10*3/uL (ref 150–379)
RBC: 4.68 x10E6/uL (ref 3.77–5.28)
RDW: 14.7 % (ref 12.3–15.4)
WBC: 9.2 10*3/uL (ref 3.4–10.8)

## 2016-07-18 LAB — FOLLICLE STIMULATING HORMONE: FSH: 6.1 m[IU]/mL

## 2016-07-18 LAB — TSH: TSH: 2.41 u[IU]/mL (ref 0.450–4.500)

## 2016-07-18 MED ORDER — MEGESTROL ACETATE 40 MG PO TABS: 40.0000 mg | ORAL_TABLET | Freq: Two times a day (BID) | ORAL | 5 refills | Status: DC

## 2016-07-18 MED FILL — MEGESTROL 40 MG TABLET: 40 | 30 days supply | Qty: 60 | Fill #0

## 2016-07-18 NOTE — Addendum Note (Signed)
Addended by: Langston Reusing on: 07/18/2016 01:15 PM   Modules accepted: Orders

## 2016-07-18 NOTE — Progress Notes (Signed)
6/19  1305  Pt called after hours nurse after office visit yesterday and stated that her prescription was not at the pharmacy. I called pt today and informed her that I have confirmed with her [facility] that they received the order and the medication is ready to be picked up. Pt then stated that she does not use [facility] pharmacy and requested Rx to be sent to the [facility] pharmacy. I honored pt's request and also updated her preferred pharmacy information in EMR/Epic. Pt voiced understanding.

## 2016-07-19 LAB — CYTOLOGY - PAP
Diagnosis: NEGATIVE
HPV: NOT DETECTED

## 2016-08-16 ENCOUNTER — Encounter: Payer: Self-pay | Admitting: Obstetrics & Gynecology

## 2016-08-16 ENCOUNTER — Encounter: Payer: Self-pay | Admitting: General Practice

## 2016-08-16 ENCOUNTER — Ambulatory Visit (INDEPENDENT_AMBULATORY_CARE_PROVIDER_SITE_OTHER): Payer: Self-pay | Admitting: Obstetrics & Gynecology

## 2016-08-16 VITALS — BP 135/81 | HR 82 | Wt 354.0 lb

## 2016-08-16 DIAGNOSIS — D259 Leiomyoma of uterus, unspecified: Secondary | ICD-10-CM

## 2016-08-16 DIAGNOSIS — N938 Other specified abnormal uterine and vaginal bleeding: Secondary | ICD-10-CM

## 2016-08-16 NOTE — Progress Notes (Signed)
   Subjective:    Patient ID: [name], female    DOB: 1965/11/27, 51 y.o.   MRN: [id number]  HPI 51 yo S AA lady is here for her results. She has had hot flashes and DUB, was convinced that she was in menopause. She is taking the megace and her bleeding has resolved.   Review of Systems     Objective:   Physical Exam Morbidly obese BFNAD Breathing, conversing, and ambulating normally FSH 6.1 TSH  Normal U/S - 8.8 cm fibroid with submucosal portion Pap normal       Assessment & Plan:  Dub- resolved with megace Continue this medication prn RTC 1 year/prn sooner

## 2016-09-21 MED FILL — MEGESTROL 40 MG TABLET: 40 | 30 days supply | Qty: 60 | Fill #1

## 2016-12-06 MED FILL — MEGESTROL 40 MG TABLET: 40 | 30 days supply | Qty: 60 | Fill #2

## 2017-02-13 ENCOUNTER — Telehealth: Payer: Self-pay | Admitting: Obstetrics & Gynecology

## 2017-02-13 ENCOUNTER — Encounter: Payer: Self-pay | Admitting: *Deleted

## 2017-02-13 NOTE — Telephone Encounter (Addendum)
Pt called needed a letter that is being treated for fibroids and bleeding to apply for  Insurance--wrote a letter for evaluation/treatment for fibroids/bleeding seen by Dr. Hulan Fray. Letter is ready to be pick up front office.

## 2017-02-20 MED FILL — MEGESTROL 40 MG TABLET: 40 | 30 days supply | Qty: 60 | Fill #3

## 2017-03-02 ENCOUNTER — Telehealth: Payer: Self-pay | Admitting: *Deleted

## 2017-03-02 NOTE — Telephone Encounter (Signed)
Pt called, is concerned she is having brown urine for 3 days now. Wants to know if she needs to come in.

## 2017-03-09 NOTE — Telephone Encounter (Signed)
Called patient back and she states that someone has already helped with her concerns. No further assistance needed.

## 2017-05-16 MED FILL — MEGESTROL 40 MG TABLET: 40 | 30 days supply | Qty: 60 | Fill #4

## 2017-07-11 MED FILL — MEGESTROL 40 MG TABLET: 40 | 30 days supply | Qty: 60 | Fill #5

## 2017-10-02 ENCOUNTER — Other Ambulatory Visit: Payer: Self-pay | Admitting: Obstetrics & Gynecology

## 2017-10-03 MED FILL — MEGESTROL 40 MG TABLET: 40 | 30 days supply | Qty: 60 | Fill #0

## 2018-02-14 MED FILL — MEGESTROL 40 MG TABLET: 40 | 30 days supply | Qty: 60 | Fill #1

## 2018-05-01 MED FILL — MEGESTROL 40 MG TABLET: 40 | 30 days supply | Qty: 60 | Fill #0

## 2018-07-11 MED FILL — MEGESTROL 40 MG TABLET: 40 | 30 days supply | Qty: 60 | Fill #0

## 2018-11-05 ENCOUNTER — Other Ambulatory Visit: Payer: Self-pay | Admitting: Obstetrics & Gynecology

## 2018-11-06 MED FILL — MEGESTROL 40 MG TABLET: 40 | 30 days supply | Qty: 60 | Fill #0

## 2019-01-30 MED FILL — MEGESTROL 40 MG TABLET: 40 | 30 days supply | Qty: 60 | Fill #1

## 2019-05-14 MED FILL — MEGESTROL 40 MG TABLET: 40 | 30 days supply | Qty: 60 | Fill #2

## 2019-05-26 ENCOUNTER — Emergency Department (HOSPITAL_COMMUNITY)
Admission: EM | Admit: 2019-05-26 | Discharge: 2019-05-26 | Disposition: A | Discharge status: Home / Self Care | Payer: PRIVATE HEALTH INSURANCE | Attending: Emergency Medicine | Admitting: Emergency Medicine

## 2019-05-26 ENCOUNTER — Other Ambulatory Visit: Payer: Self-pay

## 2019-05-26 ENCOUNTER — Emergency Department (HOSPITAL_COMMUNITY): Payer: PRIVATE HEALTH INSURANCE

## 2019-05-26 DIAGNOSIS — I471 Supraventricular tachycardia: Secondary | ICD-10-CM | POA: Insufficient documentation

## 2019-05-26 DIAGNOSIS — Z79899 Other long term (current) drug therapy: Secondary | ICD-10-CM | POA: Insufficient documentation

## 2019-05-26 DIAGNOSIS — R002 Palpitations: Secondary | ICD-10-CM | POA: Diagnosis present

## 2019-05-26 LAB — COMPREHENSIVE METABOLIC PANEL
ALT: 35 U/L (ref 0–44)
AST: 38 U/L (ref 15–41)
Albumin: 3.8 g/dL (ref 3.5–5.0)
Alkaline Phosphatase: 90 U/L (ref 38–126)
Anion gap: 11 (ref 5–15)
BUN: 11 mg/dL (ref 6–20)
CO2: 18 mmol/L — ABNORMAL LOW (ref 22–32)
Calcium: 9.5 mg/dL (ref 8.9–10.3)
Chloride: 111 mmol/L (ref 98–111)
Creatinine, Ser: 1.11 mg/dL — ABNORMAL HIGH (ref 0.44–1.00)
GFR calc Af Amer: >60 mL/min (ref >60)
GFR calc non Af Amer: 57 mL/min — ABNORMAL LOW (ref >60)
Glucose, Bld: 188 mg/dL — ABNORMAL HIGH (ref 70–99)
Potassium: 4 mmol/L (ref 3.5–5.1)
Sodium: 140 mmol/L (ref 135–145)
Total Bilirubin: 1 mg/dL (ref 0.3–1.2)
Total Protein: 6.7 g/dL (ref 6.5–8.1)

## 2019-05-26 LAB — CBC WITH DIFFERENTIAL/PLATELET
Abs Immature Granulocytes: 0.04 10*3/uL (ref 0.00–0.07)
Basophils Absolute: 0.1 10*3/uL (ref 0.0–0.1)
Basophils Relative: 1 %
Eosinophils Absolute: 0.4 10*3/uL (ref 0.0–0.5)
Eosinophils Relative: 4 %
HCT: 46.1 % — ABNORMAL HIGH (ref 36.0–46.0)
Hemoglobin: 15.2 g/dL — ABNORMAL HIGH (ref 12.0–15.0)
Immature Granulocytes: 0 %
Lymphocytes Relative: 30 %
Lymphs Abs: 3.1 10*3/uL (ref 0.7–4.0)
MCH: 30.6 pg (ref 26.0–34.0)
MCHC: 33 g/dL (ref 30.0–36.0)
MCV: 92.9 fL (ref 80.0–100.0)
Monocytes Absolute: 0.8 10*3/uL (ref 0.1–1.0)
Monocytes Relative: 8 %
Neutro Abs: 6 10*3/uL (ref 1.7–7.7)
Neutrophils Relative %: 57 %
Platelets: 386 10*3/uL (ref 150–400)
RBC: 4.96 MIL/uL (ref 3.87–5.11)
RDW: 13.2 % (ref 11.5–15.5)
WBC: 10.5 10*3/uL (ref 4.0–10.5)
nRBC: 0 % (ref 0.0–0.2)

## 2019-05-26 MED ORDER — METOPROLOL TARTRATE 25 MG PO TABS
25.0000 mg | ORAL_TABLET | Freq: Once | ORAL | 0 refills | Status: DC | PRN
Start: 2019-05-26 — End: 2023-11-01

## 2019-05-26 MED ORDER — METOPROLOL TARTRATE 5 MG/5ML IV SOLN
5.0000 mg | Freq: Once | INTRAVENOUS | Status: AC
  Administered 2019-05-26: 5 mg via INTRAVENOUS
  Filled 2019-05-26: qty 5

## 2019-05-26 NOTE — Discharge Instructions (Addendum)
If you are unable to get an appointment with Dr. Terrence Dupont then the [facility] cardiology office should be calling you for a follow-up appointment.  Only use the medication you were prescribed today if you start having palpitations.  You do not need to take this regularly.  You can try 1 tablet first and if in 30 or 45 minutes it does not resolve your fast heart rate you can take a second tablet.  If you continue to have palpitations after that you should go to the hospital.

## 2019-05-26 NOTE — ED Triage Notes (Signed)
Pt in POV with tachycardia (180-190's) and sob. States she has this happen from time, denies any cp. Woke up this way today. Taken to room on arrival

## 2019-05-26 NOTE — ED Provider Notes (Addendum)
[facility] EMERGENCY DEPARTMENT Provider Note   CSN: [id number] Arrival date & time: 05/26/19  0750     History Chief Complaint  Patient presents with  . Tachycardia    [name] is a 54 y.o. female.  Patient is a 54 year old female with a history of SVT, anemia, fibroids and morbid obesity who presents today with palpitations.  Patient reports the palpitations started approximately 1 hour prior to arrival.  She states they make her feel slightly dizzy occasional pressure in the throat and intermittent fluttering in her abdomen.  She also will sometimes feel sweaty.  She reports that these episodes have been present for a while and she did see a cardiologist several years ago but they did not want to do anything about it.  Patient reports since that time she has not had insurance so she just stopped coming to the emergency room.  She states sometimes the episodes can last up to 13 hours but then will finally go away.  However she states it has been more frequent lately and she has had 3 episodes in the last month.  She currently is taking Megace for heavy periods and then vitamins but no other medications at this time.  She denies recent illness with cough, congestion or shortness of breath.  She has not had any recent vomiting or diarrhea.  The history is provided by the patient.       Past Medical History:  Diagnosis Date  . Anemia   . Depression   . Dysrhythmia 12/29/10   "beating fast cause I don't have enough blood"  . Menorrhagia   . Morbid obesity (HCC)    BMI 47  . Postural dizziness 12/29/10   "cause my blood count is so low"  . Refusal of blood transfusions as patient is Jehov[employer]'s Witness 12/29/10  . Uterine fibroid     Patient Active Problem List   Diagnosis Date Noted  . Morbid obesity ([place]) 07/17/2016  . Constipation 09/14/2015  . SVT (supraventricular tachycardia) ([place]) 08/20/2013  . Anemia 08/07/2013  . Menometrorrhagia 01/11/2011  .  Uterine Fibroids 01/11/2011  . Microcytic anemia 01/02/2011    Past Surgical History:  Procedure Laterality Date  . NO PAST SURGERIES    . TOOTH EXTRACTION       OB History    Gravida  1   Para  1   Term  1   Preterm      AB      Living  1     SAB      TAB      Ectopic      Multiple      Live Births              Family History  Problem Relation Age of Onset  . Breast cancer Maternal Grandmother 60  . Heart disease Mother     Social History   Tobacco Use  . Smoking status: Never Smoker  . Smokeless tobacco: Never Used  Substance Use Topics  . Alcohol use: No    Comment: "one mixed or cooler 2 X/month"  . Drug use: No    Home Medications Prior to Admission medications   Medication Sig Start Date End Date Taking? Authorizing Provider  acetaminophen (TYLENOL) 325 MG tablet Take 650 mg by mouth every 6 (six) hours as needed.    [provider]  megestrol (MEGACE) 40 MG tablet TAKE 1 TABLET BY MOUTH 2 TIMES DAILY. 11/06/18   Dove,  Wilhemina Cash, MD    Allergies    Patient has no known allergies.  Review of Systems   Review of Systems  All other systems reviewed and are negative.   Physical Exam Updated Vital Signs BP (!) 122/106   Pulse 88   Temp 98.3 F (36.8 C) (Oral)   Resp (!) 28   LMP 05/19/2019 Comment: "I'm on medication so it makes me not have a period"  SpO2 99%   Physical Exam Vitals and nursing note reviewed.  Constitutional:      General: She is not in acute distress.    Appearance: She is well-developed. She is obese. She is diaphoretic.  HENT:     Head: Normocephalic and atraumatic.     Mouth/Throat:     Mouth: Mucous membranes are moist.  Eyes:     Pupils: Pupils are equal, round, and reactive to light.  Cardiovascular:     Rate and Rhythm: Regular rhythm. Tachycardia present.     Heart sounds: Normal heart sounds. No murmur. No friction rub.  Pulmonary:     Effort: Pulmonary effort is normal.     Breath  sounds: Normal breath sounds. No wheezing or rales.  Abdominal:     General: Bowel sounds are normal. There is no distension.     Palpations: Abdomen is soft.     Tenderness: There is no abdominal tenderness. There is no guarding or rebound.  Musculoskeletal:        General: No tenderness. Normal range of motion.     Comments: No edema  Skin:    General: Skin is warm.     Coloration: Skin is pale.     Findings: No rash.  Neurological:     General: No focal deficit present.     Mental Status: She is alert and oriented to person, place, and time. Mental status is at baseline.     Cranial Nerves: No cranial nerve deficit.  Psychiatric:        Mood and Affect: Mood normal.        Behavior: Behavior normal.        Thought Content: Thought content normal.     ED Results / Procedures / Treatments   Labs (all labs ordered are listed, but only abnormal results are displayed) Labs Reviewed  CBC WITH DIFFERENTIAL/PLATELET - Abnormal; Notable for the following components:      Result Value   Hemoglobin 15.2 (*)    HCT 46.1 (*)    All other components within normal limits  COMPREHENSIVE METABOLIC PANEL - Abnormal; Notable for the following components:   CO2 18 (*)    Glucose, Bld 188 (*)    Creatinine, Ser 1.11 (*)    GFR calc non Af Amer 57 (*)    All other components within normal limits    EKG EKG Interpretation  Date/Time:  Monday May 26 2019 08:52:58 EDT Ventricular Rate:  95 PR Interval:  128 QRS Duration: 72 QT Interval:  346 QTC Calculation: 434 R Axis:   -28 Text Interpretation: Normal sinus rhythm Low voltage QRS Septal infarct , age undetermined Supraventricular tachycardia RESOLVED SINCE PREVIOUS Confirmed by Blanchie Dessert [id number]) on 05/26/2019 9:32:11 AM   Radiology DG Chest Port 1 View  Result Date: 05/26/2019 CLINICAL DATA:  Palpitations. EXAM: PORTABLE CHEST 1 VIEW COMPARISON:  08/06/2013 FINDINGS: The heart size and mediastinal contours are within  normal limits. There is no evidence of pulmonary edema, consolidation, pneumothorax, nodule or pleural fluid. The visualized skeletal structures  are unremarkable. IMPRESSION: No active disease. Electronically Signed   By: Aletta Edouard M.D.   On: 05/26/2019 08:40    Procedures Procedures (including critical care time)  Medications Ordered in ED Medications  metoprolol tartrate (LOPRESSOR) injection 5 mg (5 mg Intravenous Given 05/26/19 [id number])    ED Course  I have reviewed the triage vital signs and the nursing notes.  Pertinent labs & imaging results that were available during my care of the patient were reviewed by me and considered in my medical decision making (see chart for details).    MDM Rules/Calculators/A&P                      Patient is a 54 year old female presenting today with tachycardia and palpitations.  Patient's heart rate upon arrival was 180 with a stable blood pressure.  Patient's initial EKG showed a wide complex tachycardia and repeat EKG showed a rhythm more consistent with SVT.  Patient had received adenosine in the past and states that it made her feel awful and requested not to get that medication.  She denies any exacerbating features that would have brought this on.  She has not been under the care of a doctor in a few years because she had lost her friends and states when this happens she usually just stays home however she now has insurance and that is why she came to the hospital today.  Patient was given 5 mg of IV Lopressor with termination of SVT to heart rate of 85 and resolution of her symptoms.  Patient's hemoglobin today is stable at 15 and creatinine of 1.11 which is slightly above her baseline from 2 years ago.  Feel that patient is stable for discharge today however will get follow-up with cardiology for ongoing intermittent SVT.  Patient would like to follow-up with Dr. Terrence Dupont however if there are no appointments available or she cannot see him [facility]  cardiology will also call her for follow-up.  MDM Number of Diagnoses or Management Options SVT (supraventricular tachycardia) ([place]): established, worsening   Amount and/or Complexity of Data Reviewed Clinical lab tests: ordered and reviewed Tests in the radiology section of CPT: ordered and reviewed Tests in the medicine section of CPT: ordered and reviewed Decide to obtain previous medical records or to obtain history from someone other than the patient: yes Obtain history from someone other than the patient: yes Review and summarize past medical records: yes Discuss the patient with other providers: yes Independent visualization of images, tracings, or specimens: yes  Risk of Complications, Morbidity, and/or Mortality Presenting problems: moderate Diagnostic procedures: low Management options: low  Patient Progress Patient progress: improved    Final Clinical Impression(s) / ED Diagnoses Final diagnoses:  SVT (supraventricular tachycardia) ([place])    Rx / DC Orders ED Discharge Orders         Ordered    metoprolol tartrate (LOPRESSOR) 25 MG tablet  Once PRN     05/26/19 1017           Blanchie Dessert, MD 05/26/19 1018    Blanchie Dessert, MD 05/26/19 1021

## 2019-05-26 NOTE — ED Notes (Signed)
HR decreased to 86 immediately post metoprolol.

## 2019-06-05 ENCOUNTER — Encounter: Payer: Self-pay | Admitting: Cardiovascular Disease

## 2019-06-11 ENCOUNTER — Ambulatory Visit: Payer: PRIVATE HEALTH INSURANCE | Admitting: Cardiovascular Disease

## 2019-06-13 ENCOUNTER — Encounter: Payer: Self-pay | Admitting: Emergency Medicine

## 2021-02-16 ENCOUNTER — Emergency Department (HOSPITAL_COMMUNITY): Payer: BC Managed Care – PPO

## 2021-02-16 ENCOUNTER — Encounter (HOSPITAL_COMMUNITY): Payer: Self-pay | Admitting: *Deleted

## 2021-02-16 ENCOUNTER — Other Ambulatory Visit: Payer: Self-pay

## 2021-02-16 ENCOUNTER — Emergency Department (HOSPITAL_COMMUNITY)
Admission: EM | Admit: 2021-02-16 | Discharge: 2021-02-17 | Disposition: A | Discharge status: Home / Self Care | Payer: BC Managed Care – PPO | Attending: Emergency Medicine | Admitting: Emergency Medicine

## 2021-02-16 DIAGNOSIS — L299 Pruritus, unspecified: Secondary | ICD-10-CM | POA: Insufficient documentation

## 2021-02-16 DIAGNOSIS — R197 Diarrhea, unspecified: Secondary | ICD-10-CM | POA: Insufficient documentation

## 2021-02-16 DIAGNOSIS — R1013 Epigastric pain: Secondary | ICD-10-CM | POA: Insufficient documentation

## 2021-02-16 DIAGNOSIS — D259 Leiomyoma of uterus, unspecified: Secondary | ICD-10-CM | POA: Insufficient documentation

## 2021-02-16 DIAGNOSIS — R42 Dizziness and giddiness: Secondary | ICD-10-CM | POA: Diagnosis not present

## 2021-02-16 DIAGNOSIS — R109 Unspecified abdominal pain: Secondary | ICD-10-CM

## 2021-02-16 DIAGNOSIS — Z20822 Contact with and (suspected) exposure to covid-19: Secondary | ICD-10-CM | POA: Diagnosis not present

## 2021-02-16 LAB — URINALYSIS, ROUTINE W REFLEX MICROSCOPIC
Bilirubin Urine: NEGATIVE
Glucose, UA: NEGATIVE mg/dL
Hgb urine dipstick: NEGATIVE
Ketones, ur: NEGATIVE mg/dL
Leukocytes,Ua: NEGATIVE
Nitrite: NEGATIVE
Protein, ur: NEGATIVE mg/dL
Specific Gravity, Urine: 1.02 (ref 1.005–1.030)
pH: 5.5 (ref 5.0–8.0)

## 2021-02-16 LAB — COMPREHENSIVE METABOLIC PANEL
ALT: 16 U/L (ref 0–44)
AST: 17 U/L (ref 15–41)
Albumin: 3.6 g/dL (ref 3.5–5.0)
Alkaline Phosphatase: 84 U/L (ref 38–126)
Anion gap: 6 (ref 5–15)
BUN: 11 mg/dL (ref 6–20)
CO2: 22 mmol/L (ref 22–32)
Calcium: 9.4 mg/dL (ref 8.9–10.3)
Chloride: 108 mmol/L (ref 98–111)
Creatinine, Ser: 0.92 mg/dL (ref 0.44–1.00)
GFR, Estimated: >60 mL/min (ref >60)
Glucose, Bld: 103 mg/dL — ABNORMAL HIGH (ref 70–99)
Potassium: 3.7 mmol/L (ref 3.5–5.1)
Sodium: 136 mmol/L (ref 135–145)
Total Bilirubin: 0.7 mg/dL (ref 0.3–1.2)
Total Protein: 6.5 g/dL (ref 6.5–8.1)

## 2021-02-16 LAB — LIPASE, BLOOD: Lipase: 31 U/L (ref 11–51)

## 2021-02-16 LAB — CBC
HCT: 43 % (ref 36.0–46.0)
Hemoglobin: 14.2 g/dL (ref 12.0–15.0)
MCH: 29.8 pg (ref 26.0–34.0)
MCHC: 33 g/dL (ref 30.0–36.0)
MCV: 90.3 fL (ref 80.0–100.0)
Platelets: 290 10*3/uL (ref 150–400)
RBC: 4.76 MIL/uL (ref 3.87–5.11)
RDW: 12.8 % (ref 11.5–15.5)
WBC: 9.4 10*3/uL (ref 4.0–10.5)
nRBC: 0 % (ref 0.0–0.2)

## 2021-02-16 MED ORDER — SODIUM CHLORIDE 0.9 % IV BOLUS
2000.0000 mL | Freq: Once | INTRAVENOUS | Status: AC
  Administered 2021-02-16: 2000 mL via INTRAVENOUS

## 2021-02-16 MED ORDER — DIPHENHYDRAMINE HCL 50 MG/ML IJ SOLN
12.5000 mg | Freq: Once | INTRAMUSCULAR | Status: AC
  Administered 2021-02-16: 12.5 mg via INTRAVENOUS
  Filled 2021-02-16: qty 1

## 2021-02-16 MED ORDER — IOHEXOL 350 MG/ML SOLN
100.0000 mL | Freq: Once | INTRAVENOUS | Status: AC | PRN
  Administered 2021-02-16: 100 mL via INTRAVENOUS

## 2021-02-16 MED ORDER — FAMOTIDINE IN NACL 20-0.9 MG/50ML-% IV SOLN
20.0000 mg | Freq: Once | INTRAVENOUS | Status: AC
  Administered 2021-02-16: 20 mg via INTRAVENOUS
  Filled 2021-02-16: qty 50

## 2021-02-16 NOTE — ED Provider Notes (Signed)
[facility] EMERGENCY DEPARTMENT Provider Note   CSN: [id number] Arrival date & time: 02/16/21  2030     History  Chief Complaint  Patient presents with   Diarrhea    [name] is a 56 y.o. female with a hx of anemia, uterine fibroids, depression, and prior SVT who presents to the ED with complaints of diarrhea x 1 week and pruritus that began today. Patient reports multiple episodes of diarrhea per day, non bloody, with associated intermittent abdominal pain. No alleviating/aggravating factors. Did start a new diet about 1 week ago where she has cut out fats and sugars. She notes today she started to have itching to her trunk and extremities, no rash, no known new exposures. She feels kind of out of it and lightheaded with position changes. Denies fever, chills, vomiting, dyspnea, chest pain, sensation of throat closing or intra-oral swelling. Denies new meds. Denies recent abx/foreign travel. HPI     Home Medications Prior to Admission medications   Medication Sig Start Date End Date Taking? Authorizing Provider  acetaminophen (TYLENOL) 325 MG tablet Take 650 mg by mouth every 6 (six) hours as needed.    [provider]  megestrol (MEGACE) 40 MG tablet TAKE 1 TABLET BY MOUTH 2 TIMES DAILY. 11/06/18   Emily Filbert, MD  metoprolol tartrate (LOPRESSOR) 25 MG tablet Take 1 tablet (25 mg total) by mouth once as needed for up to 1 dose (take 1 tablet when you feel palpitations and wait 30 min.  If palpitations continue you can take a second tablet and then go to the hospital). 05/26/19   Blanchie Dessert, MD      Allergies    Patient has no known allergies.    Review of Systems   Review of Systems  Constitutional:  Negative for chills and fever.  Respiratory:  Negative for cough and shortness of breath.   Cardiovascular:  Negative for chest pain.  Gastrointestinal:  Positive for abdominal pain and diarrhea. Negative for blood in stool and vomiting.   Genitourinary:  Negative for dysuria, vaginal bleeding and vaginal discharge.  Skin:  Negative for rash.       Positive for pruritus.   Neurological:  Negative for syncope.  All other systems reviewed and are negative.  Physical Exam Updated Vital Signs BP (!) 145/80    Pulse 85    Temp 98.1 F (36.7 C) (Oral)    Resp 16    SpO2 98%  Physical Exam Vitals and nursing note reviewed.  Constitutional:      General: She is not in acute distress.    Appearance: She is well-developed. She is not toxic-appearing.  HENT:     Head: Normocephalic and atraumatic.  Eyes:     General:        Right eye: No discharge.        Left eye: No discharge.     Conjunctiva/sclera: Conjunctivae normal.  Cardiovascular:     Rate and Rhythm: Normal rate and regular rhythm.     Pulses:          Radial pulses are 2+ on the right side and 2+ on the left side.  Pulmonary:     Effort: Pulmonary effort is normal. No respiratory distress.     Breath sounds: Normal breath sounds. No wheezing, rhonchi or rales.  Abdominal:     General: There is no distension.     Palpations: Abdomen is soft.     Tenderness: There is abdominal tenderness (  epigastric & left abdomen). There is no guarding or rebound.  Musculoskeletal:     Cervical back: Neck supple.     Comments: Moving all extremities without difficulty  No obvious deformities. Well perfused x 4.  Skin:    General: Skin is warm and dry.     Coloration: Skin is not jaundiced.     Findings: No rash. Rash is not nodular, purpuric, pustular, scaling, urticarial or vesicular.  Neurological:     General: No focal deficit present.     Mental Status: She is alert.     Comments: Clear speech.   Psychiatric:        Behavior: Behavior normal.    ED Results / Procedures / Treatments   Labs (all labs ordered are listed, but only abnormal results are displayed) Labs Reviewed  COMPREHENSIVE METABOLIC PANEL - Abnormal; Notable for the following components:       Result Value   Glucose, Bld 103 (*)    All other components within normal limits  URINALYSIS, ROUTINE W REFLEX MICROSCOPIC - Abnormal; Notable for the following components:   APPearance CLOUDY (*)    All other components within normal limits  LIPASE, BLOOD  CBC    EKG None  Radiology CT Abdomen Pelvis W Contrast  Result Date: 02/16/2021 CLINICAL DATA:  Diarrhea for 1 week, itchiness, rash, nausea EXAM: CT ABDOMEN AND PELVIS WITH CONTRAST TECHNIQUE: Multidetector CT imaging of the abdomen and pelvis was performed using the standard protocol following bolus administration of intravenous contrast. RADIATION DOSE REDUCTION: This exam was performed according to the departmental dose-optimization program which includes automated exposure control, adjustment of the mA and/or kV according to patient size and/or use of iterative reconstruction technique. CONTRAST:  173mL OMNIPAQUE IOHEXOL 350 MG/ML SOLN COMPARISON:  None. FINDINGS: Lower chest: No acute pleural or parenchymal lung disease. Hepatobiliary: No focal liver abnormality is seen. No gallstones, gallbladder wall thickening, or biliary dilatation. Pancreas: Unremarkable. No pancreatic ductal dilatation or surrounding inflammatory changes. Spleen: Normal in size without focal abnormality. Adrenals/Urinary Tract: Focal scarring ventral aspect right kidney. Otherwise the kidneys enhance normally and symmetrically. No urinary tract calculi or obstructive uropathy. Adrenals and bladder are unremarkable. Stomach/Bowel: No bowel obstruction or ileus. Normal appendix right lower quadrant. No bowel wall thickening or inflammatory change. Vascular/Lymphatic: No significant vascular findings are present. No enlarged abdominal or pelvic lymph nodes. Reproductive: Heterogeneous uterus compatible with fibroids. No adnexal masses. Other: No free fluid or free intraperitoneal gas. No abdominal wall hernia. Musculoskeletal: No acute or destructive bony lesions.  Reconstructed images demonstrate no additional findings. IMPRESSION: 1. No acute intra-abdominal or intrapelvic process. 2. Fibroid uterus. Electronically Signed   By: Randa Ngo M.D.   On: 02/16/2021 23:48    Procedures Procedures    Medications Ordered in ED Medications  famotidine (PEPCID) IVPB 20 mg premix (20 mg Intravenous New Bag/Given 02/16/21 2357)  sodium chloride 0.9 % bolus 2,000 mL (2,000 mLs Intravenous New Bag/Given 02/16/21 2353)  diphenhydrAMINE (BENADRYL) injection 12.5 mg (12.5 mg Intravenous Given 02/16/21 2356)  iohexol (OMNIPAQUE) 350 MG/ML injection 100 mL (100 mLs Intravenous Contrast Given 02/16/21 2340)    ED Course/ Medical Decision Making/ A&P                           Medical Decision Making Amount and/or Complexity of Data Reviewed Labs: ordered. Radiology: ordered.  Risk Prescription drug management.   Patient presents to the ED with complaints of abdominal pain,  diarrhea, lightheadedness, & pruritus. Nontoxic, vitals with elevated BP- low suspicion for HTN emergency, otherwise unremarkable.   Additional history obtained:  Additional history obtained from chart review & nursing note review.   Lab Tests:  I reviewed and interpreted labs, pertinent results include:  CBC, CMP, Lipase, UA: Unremarkable   Imaging Studies ordered:  I ordered imaging studies which included CT A/P,  I independently reviewed & interpreted imaging & am in agreement with radiology impression which shows: 1. No acute intra-abdominal or intrapelvic process. 2. Fibroid uterus  ED Course:  I ordered medications including fluids, benadryl & pepcid for supportive care  01:38: RE-EVAL: Patient feeling improved, tolerating PO. Repeat abdominal exam is nontender without peritoneal signs.   Problem List:  Diarrhea/abdominal pain- repeat abdominal exams w/o peritoneal signs, overall reassuring CT & labs- doubt acute surgical process such as perf, obstruction, appendicitis. NO  findings of diverticulitis on CT. No significant electrolyte derangement. No recent foreign travel/abx. Possibly diet related changes, given some burning pain possibly GERD/PUD. Will tx supportively w/ GI follow up.  Pruritus- no overlying rash, no oropharyngeal angioedema. No jaundice/hyperbilirubinemia. Will tx supportively w/ allergist follow up.    I discussed results, treatment plan, need for follow-up, and return precautions with the patient. Provided opportunity for questions, patient confirmed understanding and is in agreement with plan.   Portions of this note were generated with [employer]. Dictation errors may occur despite best attempts at proofreading.         Final Clinical Impression(s) / ED Diagnoses Final diagnoses:  Diarrhea, unspecified type  Pruritus  Abdominal pain, unspecified abdominal location  Uterine leiomyoma, unspecified location    Rx / DC Orders ED Discharge Orders          Ordered    diphenhydrAMINE (BENADRYL) 25 MG tablet  Every 6 hours PRN        02/17/21 0143    famotidine (PEPCID) 20 MG tablet  2 times daily PRN        02/17/21 0143    sucralfate (CARAFATE) 1 GM/10ML suspension  3 times daily with meals & bedtime        02/17/21 0143    pantoprazole (PROTONIX) 20 MG tablet  Daily        02/17/21 0143              Amaryllis Dyke, PA-C 02/17/21 0145    Mesner, Corene Cornea, MD 02/17/21 [phone]

## 2021-02-16 NOTE — ED Triage Notes (Signed)
Pt states diarrhea for about a week, feels "itchy all over"-denies rash-started today, pain in both arms, and feels dizzy and nauseated today.

## 2021-02-16 NOTE — ED Provider Triage Note (Addendum)
Emergency Medicine Provider Triage Evaluation Note  [name] , a 56 y.o. female  was evaluated in triage.  Pt complains of diarrhea for one week.  She reports itching for today.  She denies any new exposures today.  No rashes.    She reports abdominal pain on the right side for about a year.  She reports feeling dizzy when she moves around or stands up.   She reports that she has mild cramping before her diarrhea but otherwise no acute abdominal pain.   Review of Systems  Positive: See above Negative:   Physical Exam  BP (!) 147/89    Pulse 86    Temp 98.1 F (36.7 C) (Oral)    Resp 16    SpO2 98%  Gen:   Awake, no distress   Resp:  Normal effort  MSK:   Moves extremities without difficulty  Other:  Normal speech.   Medical Decision Making  Medically screening exam initiated at 8:58 PM.  Appropriate orders placed.  Coralynn Gaona was informed that the remainder of the evaluation will be completed by another provider, this initial triage assessment does not replace that evaluation, and the importance of remaining in the ED until their evaluation is complete.   Note: Portions of this report may have been transcribed using voice recognition software. Every effort was made to ensure accuracy; however, inadvertent computerized transcription errors may be present    Lorin Glass, Hershal Coria 02/16/21 2102   She notes she hasn't been taking her metoprolol or diltiazem regularly.  She took "the white one" one day last week.    Lorin Glass, [place] 02/16/21 2105

## 2021-02-16 NOTE — ED Notes (Signed)
ED Provider at bedside. 

## 2021-02-17 LAB — RESP PANEL BY RT-PCR (FLU A&B, COVID) ARPGX2
Influenza A by PCR: NEGATIVE
Influenza B by PCR: NEGATIVE
SARS Coronavirus 2 by RT PCR: NEGATIVE

## 2021-02-17 MED ORDER — DIPHENHYDRAMINE HCL 50 MG/ML IJ SOLN
25.0000 mg | Freq: Once | INTRAMUSCULAR | Status: AC
  Administered 2021-02-17: 25 mg via INTRAVENOUS
  Filled 2021-02-17: qty 1

## 2021-02-17 MED ORDER — FAMOTIDINE 20 MG PO TABS: 20.0000 mg | ORAL_TABLET | Freq: Two times a day (BID) | ORAL | 0 refills | Status: DC | PRN

## 2021-02-17 MED ORDER — DIPHENHYDRAMINE HCL 25 MG PO TABS: 25.0000 mg | ORAL_TABLET | Freq: Four times a day (QID) | ORAL | 0 refills | Status: DC | PRN

## 2021-02-17 MED ORDER — SUCRALFATE 1 GM/10ML PO SUSP: 1.0000 g | Freq: Three times a day (TID) | ORAL | 0 refills | Status: DC

## 2021-02-17 MED ORDER — PANTOPRAZOLE SODIUM 20 MG PO TBEC: 20.0000 mg | DELAYED_RELEASE_TABLET | Freq: Every day | ORAL | 0 refills | Status: DC

## 2021-02-17 NOTE — Discharge Instructions (Addendum)
You were seen in the emergency department today for itching, abdominal discomfort, and diarrhea.  Your work-up in the ER was overall reassuring.  Your labs did not show any significant abnormalities.  Your CT scan showed findings of fibroids. We are sending you home with the following medications to help with your symptoms:  - Protonix- please take 1 tablet in the morning prior to any meals to help with stomach acidity/pain.  - Carafate- please take prior to each meal and prior to bedtime to help with stomach acidity/pain.  -Pepcid-please take every 12 hours as needed for abdominal pain and itching - Benadryl-take every 6 hours as needed for itching.  We have prescribed you new medication(s) today. Discuss the medications prescribed today with your pharmacist as they can have adverse effects and interactions with your other medicines including over the counter and prescribed medications. Seek medical evaluation if you start to experience new or abnormal symptoms after taking one of these medicines, seek care immediately if you start to experience difficulty breathing, feeling of your throat closing, facial swelling, or rash as these could be indications of a more serious allergic reaction  Please follow attached diet guidelines.   Follow up with your primary care provider within 3 days for re-evaluation.  We have also provided information for GI, allergy, and OB/GYN follow-up. Return to the ER for new or worsening symptoms including but not limited to worsened pain, new pain, inability to keep fluids down, blood in vomit/stool, passing out, rashes, facial/oral swelling, trouble swallowing, or any other concerns.

## 2021-02-17 NOTE — ED Notes (Signed)
Pt states itching has improved but not completely gone but wants to go home. Pt given prescriptions.

## 2021-02-17 NOTE — ED Notes (Signed)
Pt ambulatory to the bathroom and back to stretcher without assistance. No acute changes noted. Will continue to monitor.

## 2021-02-17 NOTE — ED Notes (Signed)
Pt complaining of itching to breasts and belly button. PA notified and additional orders received.

## 2021-02-18 ENCOUNTER — Encounter: Payer: Self-pay | Admitting: *Deleted

## 2021-02-18 ENCOUNTER — Ambulatory Visit: Payer: BC Managed Care – PPO | Admitting: Physician Assistant

## 2021-03-28 ENCOUNTER — Ambulatory Visit: Payer: BC Managed Care – PPO | Admitting: Obstetrics and Gynecology

## 2021-04-14 ENCOUNTER — Encounter: Payer: Self-pay | Admitting: Certified Nurse Midwife

## 2021-04-14 ENCOUNTER — Other Ambulatory Visit: Payer: Self-pay

## 2021-04-14 ENCOUNTER — Other Ambulatory Visit (HOSPITAL_COMMUNITY)
Admission: RE | Admit: 2021-04-14 | Discharge: 2021-04-14 | Disposition: A | Discharge status: Home / Self Care | Payer: BC Managed Care – PPO | Source: Ambulatory Visit | Attending: Obstetrics and Gynecology | Admitting: Obstetrics and Gynecology

## 2021-04-14 ENCOUNTER — Ambulatory Visit: Payer: BC Managed Care – PPO | Admitting: Certified Nurse Midwife

## 2021-04-14 VITALS — BP 139/84 | HR 82 | Ht 66.0 in | Wt 354.3 lb

## 2021-04-14 DIAGNOSIS — Z1239 Encounter for other screening for malignant neoplasm of breast: Secondary | ICD-10-CM

## 2021-04-14 DIAGNOSIS — Z01419 Encounter for gynecological examination (general) (routine) without abnormal findings: Secondary | ICD-10-CM

## 2021-04-14 DIAGNOSIS — Z124 Encounter for screening for malignant neoplasm of cervix: Secondary | ICD-10-CM | POA: Diagnosis present

## 2021-04-14 DIAGNOSIS — Z1211 Encounter for screening for malignant neoplasm of colon: Secondary | ICD-10-CM

## 2021-04-14 DIAGNOSIS — N898 Other specified noninflammatory disorders of vagina: Secondary | ICD-10-CM | POA: Insufficient documentation

## 2021-04-14 DIAGNOSIS — D219 Benign neoplasm of connective and other soft tissue, unspecified: Secondary | ICD-10-CM

## 2021-04-14 DIAGNOSIS — Z1212 Encounter for screening for malignant neoplasm of rectum: Secondary | ICD-10-CM

## 2021-04-14 NOTE — Progress Notes (Signed)
Pt was on Megestrol for bleeding but has not taken it in over a yr.Pt also states is having an acidic smell in her discharge for the last 6 months. ?

## 2021-04-14 NOTE — Progress Notes (Signed)
? ?ANNUAL EXAM ?Patient name: [name] MRN [id number]  Date of birth: 1965/05/31 ?Chief Complaint:   ?Follow-up ? ?History of Present Illness:   ?[name] is a 56 y.o. G51P1001 African-American female being seen today for a routine annual exam.  ?Current complaints: Was recently seen at the ED for an allergic reaction that presented as GI distress. Fibroids were noted on the abdominal ultrasound and she was given a referral for care. She was aware of the fibroids and was supposed to get them removed years ago but lost her insurance. Has a history of heavy menstrual bleeding and was on Megestrol but has not had to take it in over a year. LMP was over a year ago as well. Only current complaint is an odd smell that has been going on for the past month or so. ? ? ?Last pap 07/17/2016 Results were: NILM w/ HRHPV negative. H/O abnormal pap: no ?Last mammogram: 2015. Results were: normal. Family h/o breast cancer: no ?Last colonoscopy: Never. Will schedule. ? ?Depression screen [facility] 2/9 04/14/2021 08/16/2016 07/17/2016 07/17/2016 09/14/2015  ?Decreased Interest 1 0 1 1 0  ?Down, Depressed, Hopeless 0 '1 1 1 '$ 0  ?PHQ - 2 Score '1 1 2 2 '$ 0  ?Altered sleeping '3 3 3 3 '$ -  ?Tired, decreased energy '1 2 3 3 '$ -  ?Change in appetite 3 2 0 0 -  ?Feeling bad or failure about yourself  0 '1 1 1 '$ -  ?Trouble concentrating 1 1 0 0 -  ?Moving slowly or fidgety/restless 0 0 0 0 -  ?Suicidal thoughts 0 0 0 0 -  ?PHQ-9 Score '9 10 9 9 '$ -  ? ?  ?GAD 7 : Generalized Anxiety Score 04/14/2021 08/16/2016 07/17/2016 07/17/2016  ?Nervous, Anxious, on Edge 0 '2 1 1  '$ ?Control/stop worrying '1 3 3 3  '$ ?Worry too much - different things '1 2 3 3  '$ ?Trouble relaxing 0 '2 2 2  '$ ?Restless 0 1 0 0  ?Easily annoyed or irritable 0 '1 1 1  '$ ?Afraid - awful might happen 0 '2 1 1  '$ ?Total GAD 7 Score '2 13 11 11  '$ ? ?Review of Systems:   ?Pertinent items are noted in HPI ?Denies any headaches, blurred vision, fatigue, shortness of breath, chest pain, abdominal pain, abnormal vaginal  discharge/itching/odor/irritation, problems with periods, bowel movements, urination, or intercourse unless otherwise stated above. ?Pertinent History Reviewed:  ?Reviewed past medical,surgical, social and family history.  ?Reviewed problem list, medications and allergies. ?Physical Assessment:  ? ?Vitals:  ? 04/14/21 0851  ?BP: 139/84  ?Pulse: 82  ?Weight: (!) 354 lb 4.8 oz (160.7 kg)  ?Height: '5\' 6"'$  (1.676 m)  ? Body mass index is 57.19 kg/m?. ?  ?     Physical Examination:  ? General appearance - well appearing, and in no distress ? Mental status - alert, oriented to person, place, and time ? Psych:  She has a normal mood and affect ? Skin - warm and dry, normal color, no suspicious lesions noted ? Chest - effort normal ? Heart - normal rate and regular rhythm ? Neck:  midline trachea ? Breasts - breasts appear normal ? Abdomen - soft, nontender ? Pelvic - VULVA: normal appearing vulva with no masses, tenderness or lesions  VAGINA: normal appearing vagina with normal color and discharge, no lesions  CERVIX: normal appearing cervix without discharge or lesions, no CMT ? Thin prep pap is done with HR HPV cotesting ? Extremities:  No swelling or varicosities noted ? ?  Chaperone present for exam ? ?No results found for this or any previous visit (from the past 24 hour(s)).  ?Assessment & Plan:  ?1. Women's annual routine gynecological examination ? ?2. Breast screening ?- MM 3D SCREEN BREAST BILATERAL; Future ? ?3. Cervical cancer screening ?- Cytology - PAP( [place]) ? ?4. Fibroids ?- US PELVIS (TRANSABDOMINAL ONLY); Future ? ?5. Vaginal discharge ?- Cervicovaginal ancillary only( [place]) ? ?Will follow up results of pap smear and manage accordingly. ?Mammogram scheduled ?Referral made to Gastroenterology for colonoscopy ?Routine preventative health maintenance measures emphasized. ?Please refer to After Visit Summary for other counseling recommendations.  ?    ?Mammogram: diagnostic mammo and breast u/s  ordered, or sooner if problems ?Colonoscopy: per GI, or sooner if problems ? ?Orders Placed This Encounter  ?Procedures  ? MM 3D SCREEN BREAST BILATERAL  ? US PELVIC COMPLETE WITH TRANSVAGINAL  ? Ambulatory referral to Gastroenterology  ? ? ?Meds: No orders of the defined types were placed in this encounter. ? ? ?Follow-up: Return in about 1 year (around 04/15/2022) for ANN. ? ?Gabriel Carina, CNM ?04/19/2021 ?3:24 AM ? ?

## 2021-04-18 ENCOUNTER — Ambulatory Visit: Payer: BC Managed Care – PPO

## 2021-04-18 LAB — CYTOLOGY - PAP
Adequacy: ABSENT
Comment: NEGATIVE
Diagnosis: NEGATIVE
High risk HPV: NEGATIVE

## 2021-04-19 ENCOUNTER — Ambulatory Visit
Admission: RE | Admit: 2021-04-19 | Discharge: 2021-04-19 | Disposition: A | Discharge status: Home / Self Care | Payer: BC Managed Care – PPO | Source: Ambulatory Visit | Attending: Certified Nurse Midwife | Admitting: Certified Nurse Midwife

## 2021-04-19 DIAGNOSIS — Z1239 Encounter for other screening for malignant neoplasm of breast: Secondary | ICD-10-CM

## 2021-04-21 ENCOUNTER — Ambulatory Visit (HOSPITAL_COMMUNITY): Admission: RE | Admit: 2021-04-21 | Payer: BC Managed Care – PPO | Source: Ambulatory Visit

## 2021-05-31 ENCOUNTER — Other Ambulatory Visit: Payer: Self-pay | Admitting: Certified Nurse Midwife

## 2021-05-31 DIAGNOSIS — B9689 Other specified bacterial agents as the cause of diseases classified elsewhere: Secondary | ICD-10-CM

## 2021-05-31 LAB — CERVICOVAGINAL ANCILLARY ONLY
Bacterial Vaginitis (gardnerella): POSITIVE — AB
Chlamydia: NEGATIVE
Comment: NEGATIVE
Comment: NEGATIVE
Comment: NORMAL
Neisseria Gonorrhea: NEGATIVE

## 2021-05-31 MED ORDER — METRONIDAZOLE 500 MG PO TABS: 500.0000 mg | ORAL_TABLET | Freq: Two times a day (BID) | ORAL | 0 refills | Status: DC

## 2021-06-01 ENCOUNTER — Telehealth: Payer: Self-pay

## 2021-06-01 NOTE — Telephone Encounter (Addendum)
-----   Message from Gabriel Carina, [place] sent at 05/31/2021  9:12 PM EDT ----- ?Please call Ms. Caton to let her know about her BV and the prescription waiting for pick up at her pharmacy. Thank you! ? ? ?Called pt; VM not set up.  ?

## 2021-06-02 NOTE — Telephone Encounter (Signed)
Called pt; unable to leave VM. Letter created and sent. ?

## 2021-09-15 ENCOUNTER — Encounter: Payer: Self-pay | Admitting: Certified Nurse Midwife

## 2022-03-14 IMAGING — CT CT ABD-PELV W/ CM
2 of 5 series · 17 of 46 positions shown, 19 images · IV contrast (APPLIED)
Comparison: None.

CLINICAL DATA: Diarrhea for 1 week, itchiness, rash, nausea

EXAM:
CT ABDOMEN AND PELVIS WITH CONTRAST
TECHNIQUE: Multidetector CT imaging of the abdomen and pelvis was performed
using the standard protocol following bolus administration of
intravenous contrast.

[Series 3: abdomen 5.0 · axial · 0.98mm/px · z∈[+383,+798]mm · 14 of 97 slices shown, 16 images]
[im 7/97  soft-tissue]
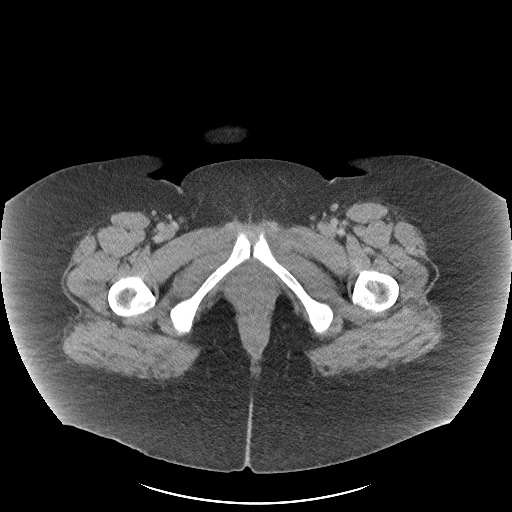
[im 7/97  bone]
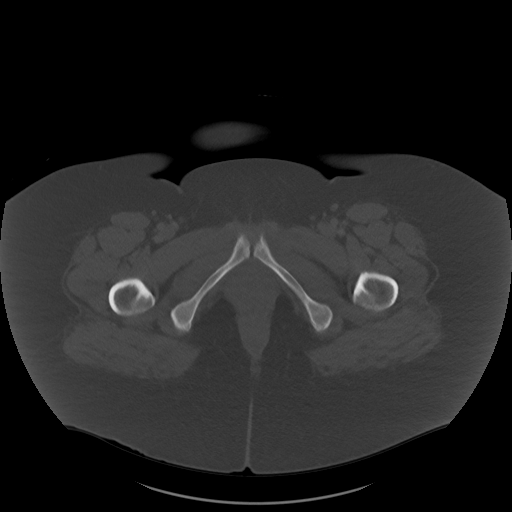
[im 13/97  soft-tissue]
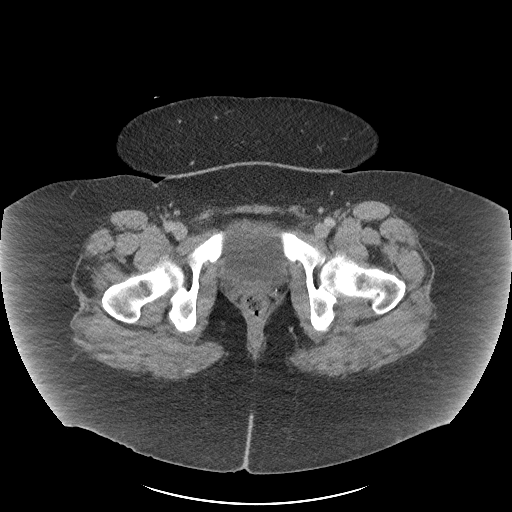
[im 20/97  soft-tissue]
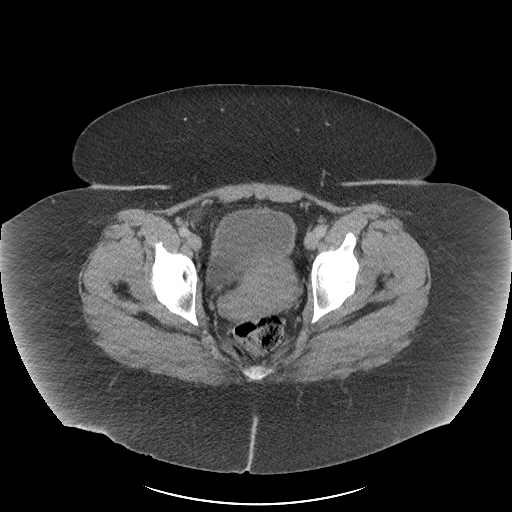
[im 26/97  soft-tissue]
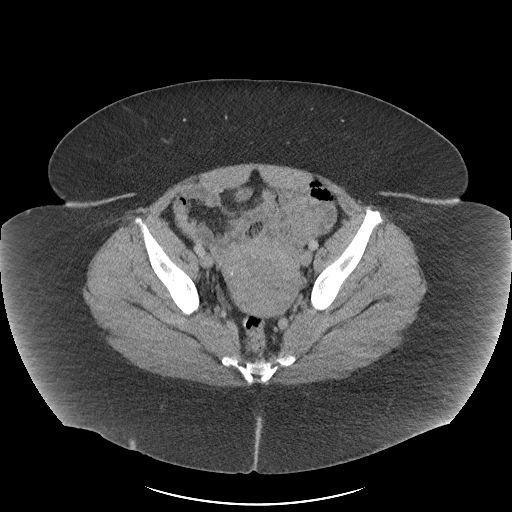
[im 33/97  soft-tissue]
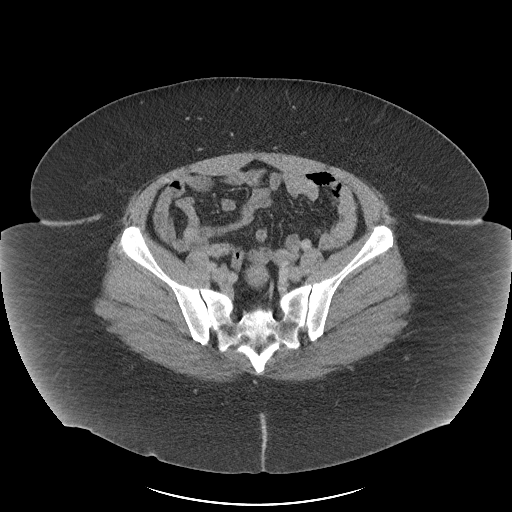
[im 39/97  soft-tissue]
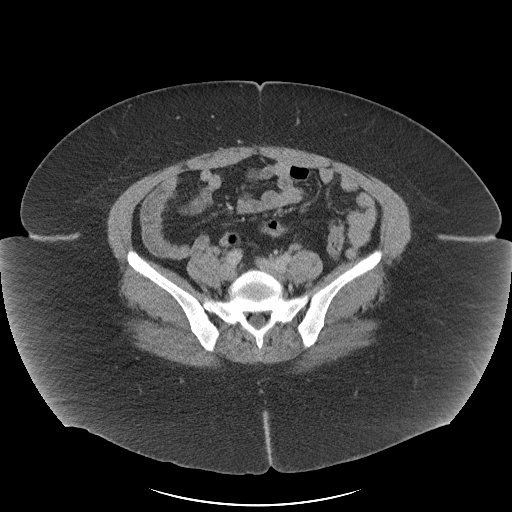
[im 45/97  soft-tissue]
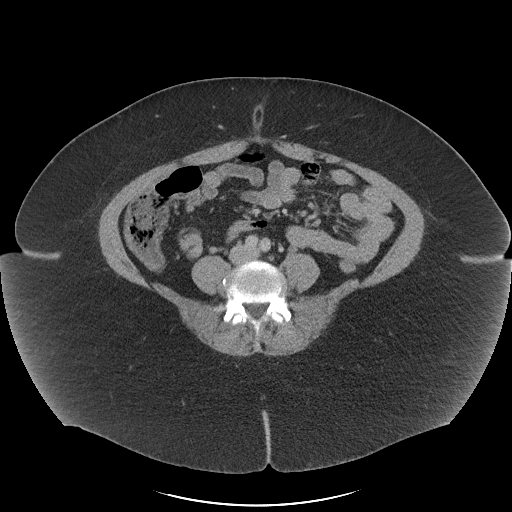
[im 52/97  soft-tissue]
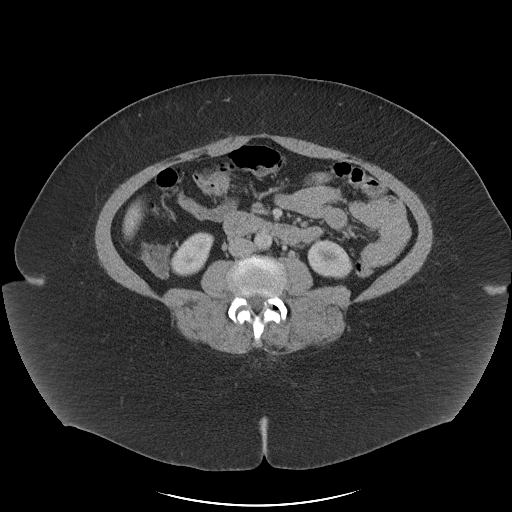
[im 58/97  soft-tissue]
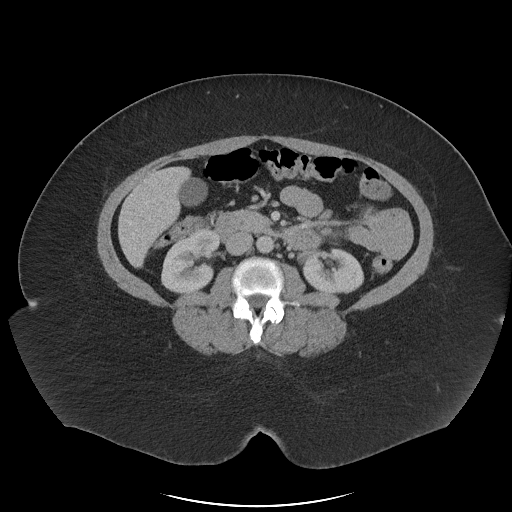
[im 58/97  bone]
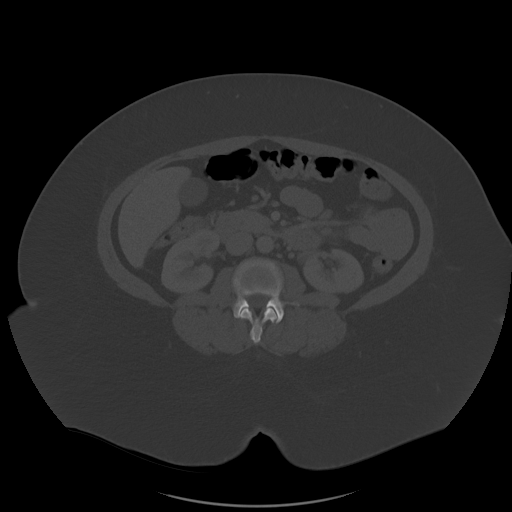
[im 65/97  soft-tissue]
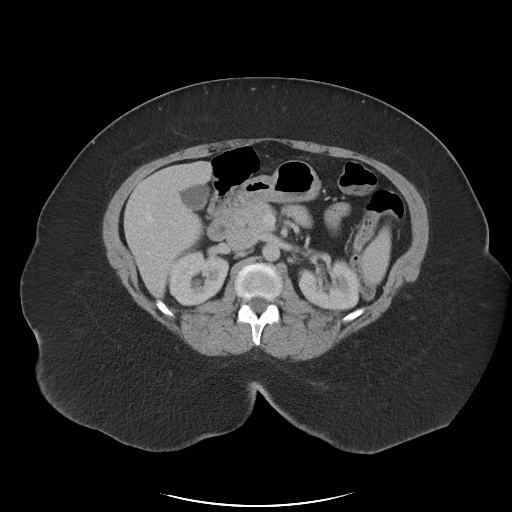
[im 71/97  soft-tissue]
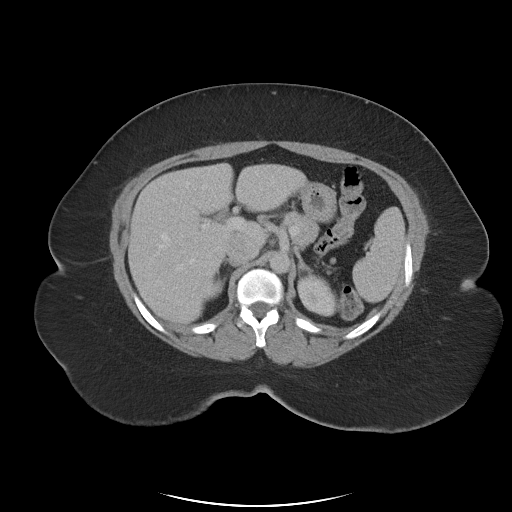
[im 77/97  soft-tissue]
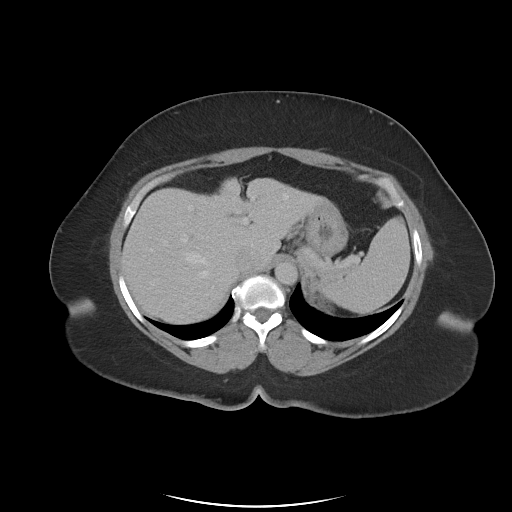
[im 84/97  soft-tissue]
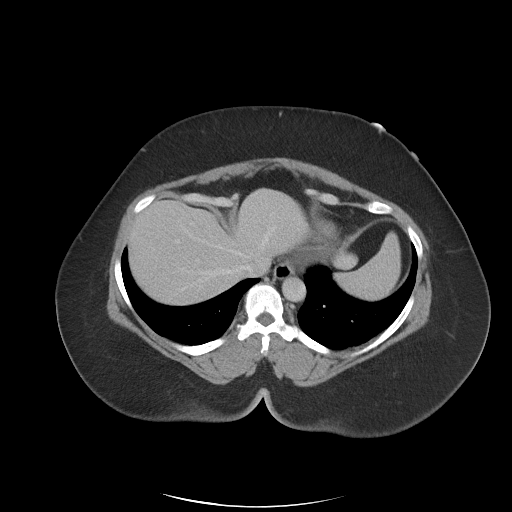
[im 90/97  soft-tissue]
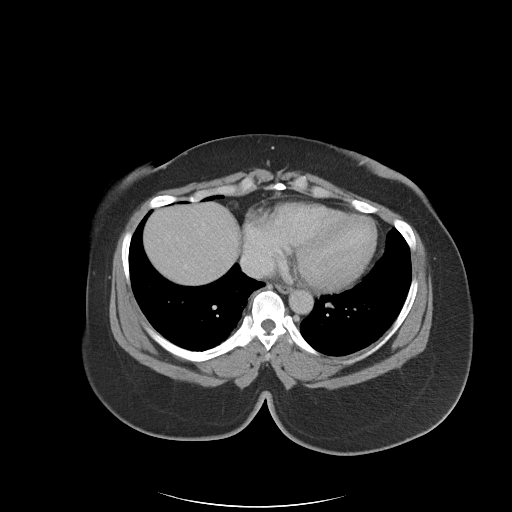

[Series 6: abdomen 3.0 mpr cor · coronal · 0.96mm/px · 3 of 94 slices shown]
[im 32/94  soft-tissue]
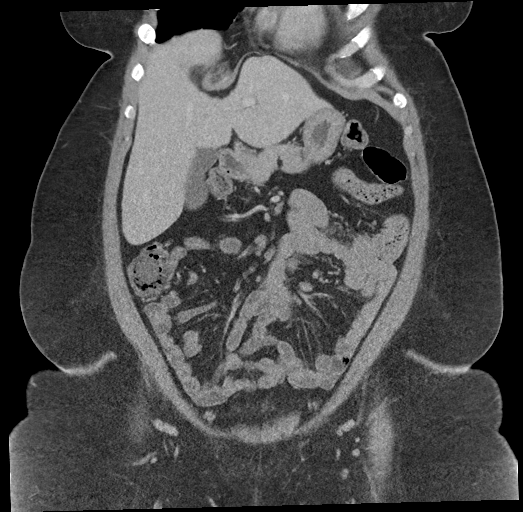
[im 42/94  soft-tissue]
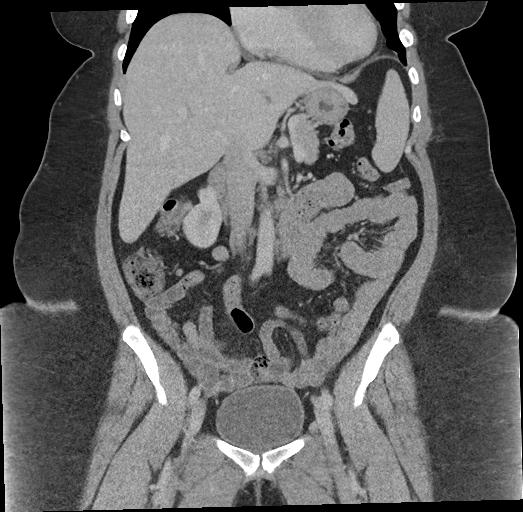
[im 52/94  soft-tissue]
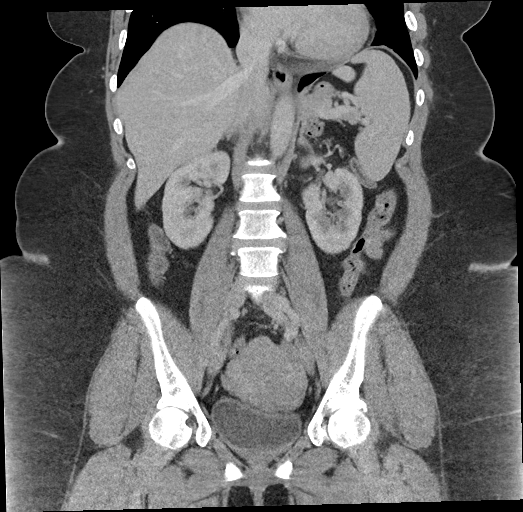

[17 of 46 positions shown; findings below may reference images not displayed]

RADIATION DOSE REDUCTION: This exam was performed according to the
departmental dose-optimization program which includes automated
exposure control, adjustment of the mA and/or kV according to
patient size and/or use of iterative reconstruction technique.

CONTRAST:  100mL OMNIPAQUE IOHEXOL 350 MG/ML SOLN
FINDINGS: Lower chest: No acute pleural or parenchymal lung disease.

Hepatobiliary: No focal liver abnormality is seen. No gallstones,
gallbladder wall thickening, or biliary dilatation.

Pancreas: Unremarkable. No pancreatic ductal dilatation or
surrounding inflammatory changes.

Spleen: Normal in size without focal abnormality.

Adrenals/Urinary Tract: Focal scarring ventral aspect right kidney.
Otherwise the kidneys enhance normally and symmetrically. No urinary
tract calculi or obstructive uropathy. Adrenals and bladder are
unremarkable.

Stomach/Bowel: No bowel obstruction or ileus. Normal appendix right
lower quadrant. No bowel wall thickening or inflammatory change.

Vascular/Lymphatic: No significant vascular findings are present. No
enlarged abdominal or pelvic lymph nodes.

Reproductive: Heterogeneous uterus compatible with fibroids. No
adnexal masses.

Other: No free fluid or free intraperitoneal gas. No abdominal wall
hernia.

Musculoskeletal: No acute or destructive bony lesions. Reconstructed
images demonstrate no additional findings.
IMPRESSION: 1. No acute intra-abdominal or intrapelvic process.
2. Fibroid uterus.

## 2022-09-03 ENCOUNTER — Emergency Department (HOSPITAL_COMMUNITY): Payer: BC Managed Care – PPO

## 2022-09-03 ENCOUNTER — Emergency Department (HOSPITAL_COMMUNITY)
Admission: EM | Admit: 2022-09-03 | Discharge: 2022-09-03 | Disposition: A | Discharge status: Home / Self Care | Payer: BC Managed Care – PPO | Attending: Emergency Medicine | Admitting: Emergency Medicine

## 2022-09-03 ENCOUNTER — Encounter (HOSPITAL_COMMUNITY): Payer: Self-pay

## 2022-09-03 ENCOUNTER — Other Ambulatory Visit: Payer: Self-pay

## 2022-09-03 DIAGNOSIS — U071 COVID-19: Secondary | ICD-10-CM | POA: Diagnosis not present

## 2022-09-03 DIAGNOSIS — R059 Cough, unspecified: Secondary | ICD-10-CM | POA: Diagnosis present

## 2022-09-03 LAB — RESP PANEL BY RT-PCR (RSV, FLU A&B, COVID)  RVPGX2
Influenza A by PCR: NEGATIVE
Influenza B by PCR: NEGATIVE
Resp Syncytial Virus by PCR: NEGATIVE
SARS Coronavirus 2 by RT PCR: POSITIVE — AB

## 2022-09-03 MED ORDER — ALBUTEROL SULFATE HFA 108 (90 BASE) MCG/ACT IN AERS
1.0000 | INHALATION_SPRAY | Freq: Once | RESPIRATORY_TRACT | Status: AC
  Administered 2022-09-03: 2 via RESPIRATORY_TRACT
  Filled 2022-09-03: qty 6.7

## 2022-09-03 MED ORDER — MOLNUPIRAVIR 200 MG PO CAPS: 4.0000 | ORAL_CAPSULE | Freq: Two times a day (BID) | ORAL | 0 refills | Status: AC

## 2022-09-03 MED ORDER — ACETAMINOPHEN 325 MG PO TABS
650.0000 mg | ORAL_TABLET | ORAL | Status: AC
  Administered 2022-09-03: 650 mg via ORAL
  Filled 2022-09-03: qty 2

## 2022-09-03 NOTE — Discharge Instructions (Addendum)
You were seen in the ER for evaluation after your at home positive COVID test.  You did test positive for COVID today. You Xray shows some questionable signs of bronchitis.  For this, you are given an albuterol inhaler to take at home.  You needs this as needed every 4-6 hours.  This may worsen your tachycardia so use as appropriately.  Additionally, he can take Tylenol or ibuprofen as needed for your body aches and fever.  You can also try over-the-counter cough and cold medication. I recommend Robitussin for coughing.  I have also sent in the medication for molnupiravir.  Please take as directed.  I am included more pronation in addition to work about this as well.  Please review.  Like we discussed, COVID is contagious for 5 to 7 days.  You will need to remain quarantined and at home.  I have included a work note.  If you have any concerns, new or worsening symptoms, please return to your nearest emergency room for evaluation.  Get help right away if: You have trouble breathing or get short of breath. You have pain or pressure in your chest. You cannot speak or move any part of your body. You are confused. Your symptoms get worse. These symptoms may be an emergency. Get help right away. Call 911. Do not wait to see if the symptoms will go away. Do not drive yourself to the hospital.

## 2022-09-03 NOTE — ED Triage Notes (Signed)
Pt came in via POV d/t having a scratchy throat since yesterday & a cough, denies fevers but did have a positive home Covid test this morning & wants to be checked here to verify.

## 2022-09-03 NOTE — ED Provider Notes (Signed)
[place] [facility] AT [facility] Provider Note   CSN: [id number] Arrival date & time: 09/03/22  1049     History Chief Complaint  Patient presents with   Covid s/s    [name] is a 57 y.o. female with h/o SVT presents to the ER for evaluation of her cough and cold symptoms since yesterday. The patient reports that she had a positive at home test, but wanted to come here for confirmation.  She reports that she started to have a scratchy throat and felt she needed to clear her throat.  Reports some subjective fever and chills.  Reports some bodyaches.  Reports a dry cough as well.  She denies any shortness of breath or any chest pain.  Reports some rhinorrhea and nasal congestion as well.  She denies any abdominal pain, nausea, vomiting.  Reports a mild frontal headache denies any visual changes or neck stiffness.  She has not tried any medications prior to arrival.  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  Occasions include metoprolol as needed for her tachycardia.  No known drug allergies.  Denies any tobacco, EtOH, or drug use.  HPI     Home Medications Prior to Admission medications   Medication Sig Start Date End Date Taking? Authorizing Provider  molnupiravir EUA (LAGEVRIO) 200 MG CAPS capsule Take 4 capsules (800 mg total) by mouth 2 (two) times daily for 5 days. 09/03/22 09/08/22 Yes Achille Rich, PA-C  acetaminophen (TYLENOL) 325 MG tablet Take 650 mg by mouth every 6 (six) hours as needed. Patient not taking: Reported on 04/14/2021    [provider]  diphenhydrAMINE (BENADRYL) 25 MG tablet Take 1 tablet (25 mg total) by mouth every 6 (six) hours as needed. Patient not taking: Reported on 04/14/2021 02/17/21   Petrucelli, Pleas Koch, PA-C  famotidine (PEPCID) 20 MG tablet Take 1 tablet (20 mg total) by mouth 2 (two) times daily as needed for heartburn or indigestion (abdominal  pain). Patient not taking: Reported on 04/14/2021 02/17/21   Petrucelli, Samantha R, PA-C  megestrol (MEGACE) 40 MG tablet TAKE 1 TABLET BY MOUTH 2 TIMES DAILY. Patient not taking: Reported on 04/14/2021 11/06/18   Allie Bossier, MD  metoprolol tartrate (LOPRESSOR) 25 MG tablet Take 1 tablet (25 mg total) by mouth once as needed for up to 1 dose (take 1 tablet when you feel palpitations and wait 30 min.  If palpitations continue you can take a second tablet and then go to the hospital). Patient not taking: Reported on 04/14/2021 05/26/19   Gwyneth Sprout, MD  metroNIDAZOLE (FLAGYL) 500 MG tablet Take 1 tablet (500 mg total) by mouth 2 (two) times daily. 05/31/21   Bernerd Limbo, CNM  pantoprazole (PROTONIX) 20 MG tablet Take 1 tablet (20 mg total) by mouth daily. Patient not taking: Reported on 04/14/2021 02/17/21   Petrucelli, Lelon Mast R, PA-C  sucralfate (CARAFATE) 1 GM/10ML suspension Take 10 mLs (1 g total) by mouth 4 (four) times daily -  with meals and at bedtime. Patient not taking: Reported on 04/14/2021 02/17/21   Petrucelli, Pleas Koch, PA-C      Allergies    Patient has no known allergies.    Review of Systems   Review of Systems  Constitutional:  Positive for chills and fever (Subjective).  HENT:  Positive for congestion, rhinorrhea and sore throat. Negative for drooling, ear pain and trouble swallowing.  Eyes:  Negative for photophobia, discharge and visual disturbance.  Respiratory:  Positive for cough. Negative for shortness of breath.   Cardiovascular:  Negative for chest pain and palpitations.  Gastrointestinal:  Negative for abdominal pain, diarrhea, nausea and vomiting.  Genitourinary:  Negative for dysuria and hematuria.  Musculoskeletal:  Positive for myalgias. Negative for arthralgias, back pain and joint swelling.  Skin:  Negative for color change and rash.  Neurological:  Positive for headaches. Negative for syncope and weakness.    Physical Exam Updated Vital  Signs BP (!) 175/74 (BP Location: Right Arm)   Pulse 80   Temp 100.2 F (37.9 C) (Oral)   Resp 16   SpO2 98%  Physical Exam Vitals and nursing note reviewed.  Constitutional:      General: She is not in acute distress.    Appearance: She is not toxic-appearing.  HENT:     Head: Normocephalic and atraumatic.     Right Ear: Tympanic membrane, ear canal and external ear normal.     Left Ear: Tympanic membrane, ear canal and external ear normal.     Nose:     Comments: Bilateral nasal turbinate edema and erythema with scant clear nasal discharge.    Mouth/Throat:     Mouth: Mucous membranes are moist.     Comments: No pharyngeal erythema, exudate, or edema noted.  Uvula midline.  Airway patent.  Moist mucous membranes. Eyes:     General: No scleral icterus.    Conjunctiva/sclera: Conjunctivae normal.  Cardiovascular:     Rate and Rhythm: Normal rate.  Pulmonary:     Effort: Pulmonary effort is normal. No respiratory distress.     Breath sounds: Normal breath sounds. No wheezing or rhonchi.     Comments: Lung sounds are clear to auscultation bilaterally.  She speaking in full sentences with ease and is satting well on room air without increased work of breathing. Musculoskeletal:        General: No deformity.     Cervical back: Normal range of motion.  Lymphadenopathy:     Cervical: No cervical adenopathy.  Skin:    General: Skin is warm and dry.  Neurological:     General: No focal deficit present.     Mental Status: She is alert.     ED Results / Procedures / Treatments   Labs (all labs ordered are listed, but only abnormal results are displayed) Labs Reviewed  RESP PANEL BY RT-PCR (RSV, FLU A&B, COVID)  RVPGX2 - Abnormal; Notable for the following components:      Result Value   SARS Coronavirus 2 by RT PCR POSITIVE (*)    All other components within normal limits    EKG None  Radiology DG Chest Portable 1 View  Result Date: 09/03/2022 CLINICAL DATA:  Cough  beginning yesterday EXAM: PORTABLE CHEST 1 VIEW COMPARISON:  05/26/2019 FINDINGS: Heart size is normal. Mediastinal shadows are normal. There may be mild central bronchial thickening but there is no infiltrate, collapse or effusion. No abnormal bone finding. IMPRESSION: Possible bronchitis. No consolidation or collapse. Electronically Signed   By: Paulina Fusi M.D.   On: 09/03/2022 13:39    Procedures Procedures   Medications Ordered in ED Medications  albuterol (VENTOLIN HFA) 108 (90 Base) MCG/ACT inhaler 1-2 puff (has no administration in time range)    ED Course/ Medical Decision Making/ A&P    Medical Decision Making Amount and/or Complexity of Data Reviewed Radiology: ordered.  Risk OTC drugs. Prescription drug management.  57 y.o. female presents to the ER for evaluation of cough and cold symptoms with positive at-home COVID test. Differential diagnosis includes but is not limited to COVID. Vital signs mildly elevated blood pressure, slightly increased in temperature at 100.2 otherwise normal heart rate, satting well on room air without increased work of breathing. Physical exam as noted above.   I independently reviewed and interpreted the patient's labs.  Positive COVID.  Negative for flu and RSV.  Chest x-ray shows possible bronchitis.  No consolidation or collapse.  Per radiology read.  I had a shared decision-making with the patient about antivirals versus over-the-counter cough and cold medication.  Patient would like to trial the molnupiravir.  Will also prescribe her an albuterol inhaler to help her with a dry cough.  X-ray shows possible bronchitis however the patient has clear lung sounds.  I do believe the albuterol inhaler will help her with her dry cough for the bronchospasm.  I did also recommend over-the-counter cough and cold medication such as Robitussin.  Molnupiravir sent in as well.  She was given some Tylenol here for her elevated temperature as well.  Overall,  she does not appear ill.  She is stable for discharge home with PCP follow-up.  Work note given and discussed need for quarantine and isolation as COVID is contagious.  We discussed the results of the labs/imaging. The plan is take medications prescribed, follow-up primary care doctor as needed. We discussed strict return precautions and red flag symptoms. The patient verbalized their understanding and agrees to the plan. The patient is stable and being discharged home in good condition.  Portions of this report may have been transcribed using voice recognition software. Every effort was made to ensure accuracy; however, inadvertent computerized transcription errors may be present.   Final Clinical Impression(s) / ED Diagnoses Final diagnoses:  COVID    Rx / DC Orders ED Discharge Orders          Ordered    molnupiravir EUA (LAGEVRIO) 200 MG CAPS capsule  2 times daily        09/03/22 1417              Achille Rich, [place] 09/03/22 1738    Terald Sleeper, MD 09/04/22 [phone]

## 2022-11-13 ENCOUNTER — Other Ambulatory Visit: Payer: Self-pay | Admitting: Certified Nurse Midwife

## 2022-11-13 DIAGNOSIS — Z0000 Encounter for general adult medical examination without abnormal findings: Secondary | ICD-10-CM

## 2022-11-15 ENCOUNTER — Other Ambulatory Visit (HOSPITAL_COMMUNITY)
Admission: RE | Admit: 2022-11-15 | Discharge: 2022-11-15 | Disposition: A | Discharge status: Home / Self Care | Payer: BC Managed Care – PPO | Source: Ambulatory Visit | Attending: Certified Nurse Midwife | Admitting: Certified Nurse Midwife

## 2022-11-15 ENCOUNTER — Encounter: Payer: Self-pay | Admitting: Certified Nurse Midwife

## 2022-11-15 ENCOUNTER — Other Ambulatory Visit: Payer: Self-pay | Admitting: Certified Nurse Midwife

## 2022-11-15 ENCOUNTER — Ambulatory Visit: Payer: BC Managed Care – PPO | Admitting: Certified Nurse Midwife

## 2022-11-15 VITALS — BP 125/74 | HR 84 | Wt 356.0 lb

## 2022-11-15 DIAGNOSIS — N644 Mastodynia: Secondary | ICD-10-CM

## 2022-11-15 DIAGNOSIS — R1030 Lower abdominal pain, unspecified: Secondary | ICD-10-CM | POA: Insufficient documentation

## 2022-11-15 DIAGNOSIS — Z01419 Encounter for gynecological examination (general) (routine) without abnormal findings: Secondary | ICD-10-CM

## 2022-11-15 NOTE — Progress Notes (Signed)
Burning in left breast for past couple months. Nipple is very tender. Burning is the worst on left side of breast under nipple. Occurs intermittently for about 30 minutes at a time. Has experienced itching on breasts before, can't remember any prior burning sensation. Last mammogram 04/19/21. Also has burning in lower abdomen over past year, happens once or twice a month.  Fleet Contras RN 11/15/22

## 2022-11-15 NOTE — Progress Notes (Signed)
ANNUAL EXAM Patient name: [name] MRN [id number]  Date of birth: September 20, 1965 Chief Complaint:   Breast Problem and Gynecologic Exam  History of Present Illness:   [name] is a 57 y.o. G20P1001 African-American female being seen today for a routine annual exam.  Current complaints: burning sensation in the left breast, burning in lower abdomen.   No LMP recorded. Patient is postmenopausal.   The pregnancy intention screening data noted above was reviewed. Potential methods of contraception were discussed. The patient elected to proceed with No data recorded.   Last pap 03/2021. Results were: NILM w/ HRHPV negative. H/O abnormal pap: no Last mammogram: 04/19/2021. Results were: normal. Family h/o breast cancer: no Last colonoscopy: took Cologuard per GI rec. Results were: normal. Family h/o colorectal cancer: no     11/15/2022   11:36 AM 04/14/2021    9:03 AM 08/16/2016    1:29 PM 07/17/2016    3:33 PM 07/17/2016    3:04 PM  Depression screen PHQ 2/9  Decreased Interest 0 1 0 1 1  Down, Depressed, Hopeless 0 0 1 1 1   PHQ - 2 Score 0 1 1 2 2   Altered sleeping 3 3 3 3 3   Tired, decreased energy 1 1 2 3 3   Change in appetite 0 3 2 0 0  Feeling bad or failure about yourself  0 0 1 1 1   Trouble concentrating 0 1 1 0 0  Moving slowly or fidgety/restless 0 0 0 0 0  Suicidal thoughts 0 0 0 0 0  PHQ-9 Score 4 9 10 9 9         11/15/2022   11:40 AM 04/14/2021    9:04 AM 08/16/2016    1:29 PM 07/17/2016    3:33 PM  GAD 7 : Generalized Anxiety Score  Nervous, Anxious, on Edge 0 0 2 1  Control/stop worrying 2 1 3 3   Worry too much - different things 1 1 2 3   Trouble relaxing 1 0 2 2  Restless 0 0 1 0  Easily annoyed or irritable 0 0 1 1  Afraid - awful might happen 0 0 2 1  Total GAD 7 Score 4 2 13 11      Review of Systems:   Pertinent items are noted in HPI Denies any headaches, blurred vision, fatigue, shortness of breath, chest pain, abdominal pain, abnormal vaginal  discharge/itching/odor/irritation, problems with periods, bowel movements, urination, or intercourse unless otherwise stated above. Pertinent History Reviewed:  Reviewed past medical,surgical, social and family history.  Reviewed problem list, medications and allergies. Physical Assessment:   Vitals:   11/15/22 1103  BP: 125/74  Pulse: 84  Weight: (!) 356 lb (161.5 kg)  Body mass index is 57.46 kg/m.        Physical Examination:   General appearance - well appearing, and in no distress  Mental status - alert, oriented to person, place, and time  Psych:  She has a normal mood and affect  Skin - warm and dry, normal color, no suspicious lesions noted  Chest - effort normal, all lung fields clear to auscultation bilaterally  Heart - normal rate and regular rhythm  Neck:  midline trachea, no thyromegaly or nodules  Breasts - breasts appear normal, no suspicious masses, no skin or nipple changes or  axillary nodes. However, tenderness in LLQ of left breast noted on palpation.   Abdomen - soft, nontender, nondistended, no masses or organomegaly  Pelvic - Deferred  Thin prep pap is not  done , not indicated   Extremities:  Edema noted  Chaperone present for exam  No results found for this or any previous visit (from the past 24 hour(s)).  Assessment & Plan:  1) Well-Woman Exam  2) Breast pain, left - Breast exam with pain noted on palpation. No lesions, skin changes. - Diagnostic mammogram ordered and scheduled with [facility].    3) Lower abdominal pain. - Wet prep, hx of BV.  - Pelvic ultrasound ordered and scheduled at [facility].   Labs/procedures today: Pelvic ultrasound ordered, Diagnostic mammogram ordered, Wet prep ordered.   Mammogram: diagnostic mammo and breast u/s ordered, or sooner if problems Colonoscopy: per GI, or sooner if problems  Orders Placed This Encounter  Procedures   US Pelvis Complete   MM 3D DIAGNOSTIC MAMMOGRAM BILATERAL BREAST     Meds: No orders of the defined types were placed in this encounter.   Follow-up: Return in about 1 year (around 11/15/2023) for Annual.  Richardson Landry, CNM 11/15/2022 12:09 PM

## 2022-11-16 LAB — CERVICOVAGINAL ANCILLARY ONLY
Bacterial Vaginitis (gardnerella): NEGATIVE
Candida Glabrata: NEGATIVE
Candida Vaginitis: NEGATIVE
Comment: NEGATIVE
Comment: NEGATIVE
Comment: NEGATIVE

## 2022-11-21 ENCOUNTER — Ambulatory Visit (HOSPITAL_COMMUNITY): Payer: BC Managed Care – PPO

## 2022-11-23 ENCOUNTER — Ambulatory Visit (HOSPITAL_COMMUNITY)
Admission: RE | Admit: 2022-11-23 | Discharge: 2022-11-23 | Disposition: A | Discharge status: Home / Self Care | Payer: BC Managed Care – PPO | Source: Ambulatory Visit | Attending: Certified Nurse Midwife | Admitting: Certified Nurse Midwife

## 2022-11-23 ENCOUNTER — Other Ambulatory Visit: Payer: Self-pay | Admitting: Certified Nurse Midwife

## 2022-11-23 DIAGNOSIS — Z01419 Encounter for gynecological examination (general) (routine) without abnormal findings: Secondary | ICD-10-CM

## 2022-11-23 DIAGNOSIS — R1030 Lower abdominal pain, unspecified: Secondary | ICD-10-CM | POA: Insufficient documentation

## 2022-11-23 DIAGNOSIS — N644 Mastodynia: Secondary | ICD-10-CM

## 2022-11-29 ENCOUNTER — Telehealth: Payer: Self-pay

## 2022-11-30 NOTE — Telephone Encounter (Signed)
VM received on nurse line requesting phone call from Edd Arbour, CNM. Called pt back. VM not set up. Pt will need to return call.

## 2022-12-07 ENCOUNTER — Ambulatory Visit
Admission: RE | Admit: 2022-12-07 | Discharge: 2022-12-07 | Disposition: A | Discharge status: Home / Self Care | Payer: BC Managed Care – PPO | Source: Ambulatory Visit | Attending: Certified Nurse Midwife | Admitting: Certified Nurse Midwife

## 2022-12-07 DIAGNOSIS — N644 Mastodynia: Secondary | ICD-10-CM

## 2023-11-01 ENCOUNTER — Encounter: Payer: Self-pay | Admitting: Family Medicine

## 2023-11-01 ENCOUNTER — Ambulatory Visit: Payer: Self-pay | Admitting: Family Medicine

## 2023-11-01 VITALS — BP 122/70 | HR 70 | Temp 98.4°F | Ht 66.0 in | Wt 349.0 lb

## 2023-11-01 DIAGNOSIS — Z0000 Encounter for general adult medical examination without abnormal findings: Secondary | ICD-10-CM

## 2023-11-01 DIAGNOSIS — Z1159 Encounter for screening for other viral diseases: Secondary | ICD-10-CM

## 2023-11-01 DIAGNOSIS — Z136 Encounter for screening for cardiovascular disorders: Secondary | ICD-10-CM

## 2023-11-01 DIAGNOSIS — Z6841 Body Mass Index (BMI) 40.0 and over, adult: Secondary | ICD-10-CM

## 2023-11-01 DIAGNOSIS — A6009 Herpesviral infection of other urogenital tract: Secondary | ICD-10-CM

## 2023-11-01 DIAGNOSIS — Z23 Encounter for immunization: Secondary | ICD-10-CM

## 2023-11-01 DIAGNOSIS — R42 Dizziness and giddiness: Secondary | ICD-10-CM

## 2023-11-01 DIAGNOSIS — Z1211 Encounter for screening for malignant neoplasm of colon: Secondary | ICD-10-CM

## 2023-11-01 DIAGNOSIS — I471 Supraventricular tachycardia, unspecified: Secondary | ICD-10-CM | POA: Diagnosis not present

## 2023-11-01 DIAGNOSIS — Z7689 Persons encountering health services in other specified circumstances: Secondary | ICD-10-CM

## 2023-11-01 DIAGNOSIS — E66813 Obesity, class 3: Secondary | ICD-10-CM

## 2023-11-01 MED ORDER — VALACYCLOVIR HCL 1 G PO TABS
1000.0000 mg | ORAL_TABLET | Freq: Two times a day (BID) | ORAL | 0 refills | Status: AC
Start: 2023-11-01 — End: 2023-11-08

## 2023-11-01 MED ORDER — METOPROLOL TARTRATE 25 MG PO TABS
25.0000 mg | ORAL_TABLET | Freq: Once | ORAL | 0 refills | Status: AC | PRN
Start: 2023-11-01 — End: ?

## 2023-11-01 NOTE — Patient Instructions (Signed)

## 2023-11-01 NOTE — Progress Notes (Signed)
 I,Jameka J Llittleton, CMA,acting as a Neurosurgeon for [employer], NP.,have documented all relevant documentation on the behalf of Bruna Creighton, NP,as directed by  Bruna Creighton, NP while in the presence of Bruna Creighton, NP.  Subjective:    Patient ID: [name] , female    DOB: May 16, 1965 , 58 y.o.   MRN: [id number]  Chief Complaint  Patient presents with   Establish Care    Patient presents today to establish care. Patient reports she hasn't seen a pcp in a while. She used to go to [facility] but she stopped going.    Annual Exam    Patient would like to have a physical done today.     HPI Discussed the use of AI scribe software for clinical note transcription with the patient, who gave verbal consent to proceed.  History of Present Illness   [name] is a 58 year old female who presents to establish care and for an annual physical exam.  She has a history of tachycardia, previously managed with metoprolol , which she has not taken in over a year due to the absence of symptoms. She experiences occasional episodes of increased heart rate, often associated with anxiety, but these resolve with calming down. Her last EKG was in 2023.  She has a history of anemia related to heavy menstrual bleeding due to fibroids, first noted in 2012. This condition was associated with episodes of tachycardia.  She experiences occasional headaches and describes a sensation of 'water on my head,' which she has not had evaluated. She takes children's aspirin to relieve the pressure associated with these headaches.  She reports a history of a burning sensation on the top of her stomach, which occurred nightly but has since resolved. She was previously referred for an ultrasound, but the procedure was not completed as it was not addressing the area of concern.  She suspects she has herpes, experiencing blister-like sores that recur every three to four months, often triggered by stress. She has not been formally  diagnosed or treated for this condition. Her mother and sister reportedly have similar symptoms.  She engages in regular physical activity, swimming 4-5 times a week for 1.5 to 2 hours per session. She has lost weight, going from 360 lbs to 349 lbs over the past month by using a meal preparation service called [employer].  She has not had recent blood work, with the last being over two years ago. Her last Pap smear was in 2023.    [name] is a 58 year old female who presents to establish care and for an annual physical exam.  She has a history of tachycardia, previously managed with metoprolol , which she has not taken in over a year due to the absence of symptoms. She experiences occasional episodes of increased heart rate, often associated with anxiety, but these resolve with calming down. Her last EKG was in 2023.  She has a history of anemia related to heavy menstrual bleeding due to fibroids, first noted in 2012. This condition was associated with episodes of tachycardia.  She experiences occasional headaches and describes a sensation of 'water on my head,' which she has not had evaluated. She takes children's aspirin to relieve the pressure associated with these headaches.  She reports a history of a burning sensation on the top of her stomach, which occurred nightly but has since resolved. She was previously referred for an ultrasound, but the procedure was not completed as it was not addressing the area of concern.  She suspects she has herpes, experiencing blister-like sores that recur every three to four months, often triggered by stress. She has not been formally diagnosed or treated for this condition. Her mother and sister reportedly have similar symptoms.  She engages in regular physical activity, swimming 4-5 times a week for 1.5 to 2 hours per session. She has lost weight, going from 360 lbs to 349 lbs over the past month by using a meal preparation service called [employer].  She  has not had recent blood work, with the last being over two years ago. Her last Pap smear was in 2023.    Her BMI is 53.She is encouraged to strive for BMI less than 30 to decrease cardiac risk. Advised to aim for at least 150 minutes of exercise per week.      Past Medical History:  Diagnosis Date   Anemia    Depression    Dysrhythmia 12/29/2010   beating fast cause I don't have enough blood   Fibroid uterus    Menorrhagia    Morbid obesity (HCC)    BMI 47   Postural dizziness 12/29/2010   cause my blood count is so low   Refusal of blood transfusions as patient is [employer] 12/29/2010   Uterine fibroid      Family History  Problem Relation Age of Onset   Breast cancer Maternal Grandmother 2   Heart disease Mother      Current Outpatient Medications:    EPINEPHrine 0.3 mg/0.3 mL IJ SOAJ injection, Inject 0.3 mg into the muscle as needed., Disp: , Rfl:    metoprolol  tartrate (LOPRESSOR ) 25 MG tablet, Take 1 tablet (25 mg total) by mouth once as needed for up to 1 dose (take 1 tablet when you feel palpitations and wait 30 min.  If palpitations continue you can take a second tablet and then go to the hospital)., Disp: 30 tablet, Rfl: 0   No Known Allergies     Social History   Tobacco Use  Smoking Status Never  Smokeless Tobacco Never   Social History   Substance and Sexual Activity  Alcohol Use No   Comment: one mixed or cooler 2 X/month   .   Review of Systems  Constitutional: Negative.   HENT: Negative.    Eyes: Negative.   Respiratory: Negative.    Cardiovascular: Negative.   Gastrointestinal: Negative.   Endocrine: Negative.   Genitourinary: Negative.   Musculoskeletal: Negative.   Skin: Negative.   Allergic/Immunologic: Negative.   Neurological: Negative.   Hematological: Negative.   Psychiatric/Behavioral: Negative.       Today's Vitals   11/01/23 1104  BP: 122/70  Pulse: 70  Temp: 98.4 F (36.9 C)  TempSrc: Oral  Weight:  (!) 349 lb (158.3 kg)  Height: 5' 6 (1.676 m)  PainSc: 0-No pain   Body mass index is 56.33 kg/m.  Wt Readings from Last 3 Encounters:  11/01/23 (!) 349 lb (158.3 kg)  11/15/22 (!) 356 lb (161.5 kg)  04/14/21 (!) 354 lb 4.8 oz (160.7 kg)     Objective:  Physical Exam Constitutional:      Appearance: Normal appearance.  HENT:     Head: Normocephalic.     Nose: Nose normal.  Cardiovascular:     Rate and Rhythm: Normal rate and regular rhythm.     Pulses: Normal pulses.     Heart sounds: Normal heart sounds.  Pulmonary:     Effort: Pulmonary effort is normal.  Breath sounds: Normal breath sounds.  Abdominal:     General: Bowel sounds are normal.  Musculoskeletal:        General: Normal range of motion.  Skin:    General: Skin is warm and dry.  Neurological:     General: No focal deficit present.     Mental Status: She is alert and oriented to person, place, and time. Mental status is at baseline.  Psychiatric:        Mood and Affect: Mood normal.         Assessment And Plan:     Establishing care with new doctor, encounter for  Encounter for general adult medical examination w/o abnormal findings -     CBC -     CMP14+EGFR  Encounter for hepatitis C screening test for low risk patient -     Hepatitis C antibody  Herpes genitalis in women -     valACYclovir HCl; Take 1 tablet (1,000 mg total) by mouth 2 (two) times daily for 7 days.  Dispense: 14 tablet; Refill: 0  Screening for heart disease -     Lipid panel  Need for hepatitis B screening test -     Hepatitis B surface antibody,qualitative  Dizziness -     CT HEAD WO CONTRAST ( ); Future  Screening for colon cancer  Need for pneumococcal 20-valent conjugate vaccination -     Pneumococcal conjugate vaccine 20-valent  Need for Tdap vaccination -     Tdap vaccine greater than or equal to 7yo IM  Paroxysmal SVT (supraventricular tachycardia) -     Metoprolol  Tartrate; Take 1 tablet (25 mg  total) by mouth once as needed for up to 1 dose (take 1 tablet when you feel palpitations and wait 30 min.  If palpitations continue you can take a second tablet and then go to the hospital).  Dispense: 30 tablet; Refill: 0 -     EKG 12-Lead  Class 3 severe obesity due to excess calories with serious comorbidity and body mass index (BMI) of 50.0 to 59.9 in adult [facility])    Assessment & Plan Adult Wellness Visit Establishing care and annual checkup. No acute issues. Last blood work over two years ago. Pap smear in 2023 with issues, no follow-up. Last colonoscopy a couple of years ago, reminders received for another. - Perform blood work. - Administer Tdap vaccine. - Perform EKG. - Discuss colonoscopy options.  Recurrent genital herpes simplex infection Recurrent blister sores every 3-4 months, often stress-induced. Suspected based on symptoms and family history. Sores cause joint pain, currently present. - Prescribe antiviral medication for herpes. - Advise on taking acetaminophen  or ibuprofen  for pain management.  Paroxysmal supraventricular tachycardia  - Prescribe metoprolol . - Advise contacting cardiologist, Dr. Levern, to schedule an appointment.  Headache with intermittent scalp dysesthesia and dizziness Intermittent sensation of water on head, sometimes with dizziness. CT of the head suggested to evaluate symptoms. - Order CT of the head.  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 Swims 4-5 times a week for 1.5 to 2 hours, beneficial for joints.  General Health Maintenance Considering COVID booster if free. Declines flu shot. Advised regular primary care visits. - Advise checking with insurance regarding COVID booster cost. - Encourage regular primary care visits.    Return for 1 year physical. Patient was given opportunity to ask questions. Patient verbalized understanding of the plan and was able to  repeat key elements of  the plan. All questions were answered to their satisfaction.   I, Bruna Creighton, NP, have reviewed all documentation for this visit. The documentation on 11/14/2023 for the exam, diagnosis, procedures, and orders are all accurate and complete.

## 2023-11-02 LAB — CMP14+EGFR
ALT: 20 IU/L (ref 0–32)
AST: 20 IU/L (ref 0–40)
Albumin: 4.3 g/dL (ref 3.8–4.9)
Alkaline Phosphatase: 99 IU/L (ref 49–135)
BUN/Creatinine Ratio: 19 (ref 9–23)
BUN: 17 mg/dL (ref 6–24)
Bilirubin Total: 0.7 mg/dL (ref 0.0–1.2)
CO2: 20 mmol/L (ref 20–29)
Calcium: 10.1 mg/dL (ref 8.7–10.2)
Chloride: 104 mmol/L (ref 96–106)
Creatinine, Ser: 0.91 mg/dL (ref 0.57–1.00)
Globulin, Total: 2.7 g/dL (ref 1.5–4.5)
Glucose: 79 mg/dL (ref 70–99)
Potassium: 4.3 mmol/L (ref 3.5–5.2)
Sodium: 140 mmol/L (ref 134–144)
Total Protein: 7 g/dL (ref 6.0–8.5)
eGFR: 74 mL/min/1.73 (ref >59)

## 2023-11-02 LAB — LIPID PANEL
Chol/HDL Ratio: 3.6 ratio (ref 0.0–4.4)
Cholesterol, Total: 186 mg/dL (ref 100–199)
HDL: 52 mg/dL (ref >39)
LDL Chol Calc (NIH): 117 mg/dL — ABNORMAL HIGH (ref 0–99)
Triglycerides: 91 mg/dL (ref 0–149)
VLDL Cholesterol Cal: 17 mg/dL (ref 5–40)

## 2023-11-02 LAB — HEPATITIS B SURFACE ANTIBODY,QUALITATIVE: Hep B Surface Ab, Qual: NONREACTIVE

## 2023-11-02 LAB — CBC
Hematocrit: 48 % — ABNORMAL HIGH (ref 34.0–46.6)
Hemoglobin: 15.4 g/dL (ref 11.1–15.9)
MCH: 30.4 pg (ref 26.6–33.0)
MCHC: 32.1 g/dL (ref 31.5–35.7)
MCV: 95 fL (ref 79–97)
Platelets: 314 x10E3/uL (ref 150–450)
RBC: 5.07 x10E6/uL (ref 3.77–5.28)
RDW: 13.3 % (ref 11.7–15.4)
WBC: 8.3 x10E3/uL (ref 3.4–10.8)

## 2023-11-02 LAB — HEPATITIS C ANTIBODY: Hep C Virus Ab: NONREACTIVE

## 2023-11-15 ENCOUNTER — Ambulatory Visit: Payer: Self-pay | Admitting: Family Medicine

## 2023-11-15 DIAGNOSIS — I471 Supraventricular tachycardia, unspecified: Secondary | ICD-10-CM | POA: Insufficient documentation

## 2023-11-15 DIAGNOSIS — R42 Dizziness and giddiness: Secondary | ICD-10-CM | POA: Insufficient documentation

## 2023-11-15 DIAGNOSIS — Z1211 Encounter for screening for malignant neoplasm of colon: Secondary | ICD-10-CM | POA: Insufficient documentation

## 2023-11-15 DIAGNOSIS — Z23 Encounter for immunization: Secondary | ICD-10-CM | POA: Insufficient documentation

## 2023-11-15 NOTE — Assessment & Plan Note (Signed)
-  Previously managed with metoprolol , not taken in over a year.  -No recent episodes, attributed past episodes to anxiety. -restart metoprolol  50 every day  Advise contacting cardiologist, Dr. Levern, to schedule an appointment.

## 2023-11-15 NOTE — Assessment & Plan Note (Signed)
 Headache with intermittent scalp dysesthesia and dizziness Intermittent sensation of water on head, sometimes with dizziness.  - Order CT of the head.

## 2024-11-06 ENCOUNTER — Encounter: Payer: Self-pay | Admitting: Family Medicine
